# Patient Record
Sex: Female | Born: 1975 | Race: Black or African American | Hispanic: No | Marital: Married | Smoking: Never smoker
Health system: Southern US, Community
[De-identification: ages and names within clinical notes are randomized; demographics above are authoritative.]

## PROBLEM LIST (undated history)

## (undated) DIAGNOSIS — E785 Hyperlipidemia, unspecified: Secondary | ICD-10-CM

## (undated) DIAGNOSIS — O09529 Supervision of elderly multigravida, unspecified trimester: Secondary | ICD-10-CM

## (undated) DIAGNOSIS — R51 Headache: Secondary | ICD-10-CM

## (undated) DIAGNOSIS — F419 Anxiety disorder, unspecified: Secondary | ICD-10-CM

## (undated) DIAGNOSIS — Z8619 Personal history of other infectious and parasitic diseases: Secondary | ICD-10-CM

## (undated) DIAGNOSIS — F32A Depression, unspecified: Secondary | ICD-10-CM

## (undated) DIAGNOSIS — D219 Benign neoplasm of connective and other soft tissue, unspecified: Secondary | ICD-10-CM

## (undated) DIAGNOSIS — D649 Anemia, unspecified: Secondary | ICD-10-CM

## (undated) DIAGNOSIS — I10 Essential (primary) hypertension: Secondary | ICD-10-CM

## (undated) DIAGNOSIS — T7840XA Allergy, unspecified, initial encounter: Secondary | ICD-10-CM

## (undated) DIAGNOSIS — E669 Obesity, unspecified: Secondary | ICD-10-CM

## (undated) DIAGNOSIS — Z87440 Personal history of urinary (tract) infections: Secondary | ICD-10-CM

## (undated) HISTORY — PX: OTHER SURGICAL HISTORY: SHX169

## (undated) HISTORY — DX: Hyperlipidemia, unspecified: E78.5

## (undated) HISTORY — DX: Personal history of urinary (tract) infections: Z87.440

## (undated) HISTORY — DX: Depression, unspecified: F32.A

## (undated) HISTORY — DX: Personal history of other infectious and parasitic diseases: Z86.19

## (undated) HISTORY — DX: Obesity, unspecified: E66.9

## (undated) HISTORY — DX: Headache: R51

## (undated) HISTORY — DX: Anxiety disorder, unspecified: F41.9

## (undated) HISTORY — DX: Anemia, unspecified: D64.9

## (undated) HISTORY — DX: Supervision of elderly multigravida, unspecified trimester: O09.529

## (undated) HISTORY — DX: Essential (primary) hypertension: I10

## (undated) HISTORY — DX: Benign neoplasm of connective and other soft tissue, unspecified: D21.9

## (undated) HISTORY — DX: Allergy, unspecified, initial encounter: T78.40XA

## (undated) HISTORY — PX: MOUTH SURGERY: SHX715

---
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
## 2000-11-23 ENCOUNTER — Emergency Department (HOSPITAL_COMMUNITY): Admission: EM | Admit: 2000-11-23 | Discharge: 2000-11-23 | Payer: Self-pay | Admitting: Emergency Medicine

## 2000-11-23 ENCOUNTER — Encounter: Payer: Self-pay | Admitting: Emergency Medicine

## 2001-12-01 ENCOUNTER — Other Ambulatory Visit: Admission: RE | Admit: 2001-12-01 | Discharge: 2001-12-01 | Payer: Self-pay | Admitting: Obstetrics and Gynecology

## 2002-03-17 ENCOUNTER — Ambulatory Visit (HOSPITAL_COMMUNITY): Admission: RE | Admit: 2002-03-17 | Discharge: 2002-03-17 | Payer: Self-pay | Admitting: Obstetrics and Gynecology

## 2002-03-17 ENCOUNTER — Encounter: Payer: Self-pay | Admitting: Obstetrics and Gynecology

## 2002-04-21 ENCOUNTER — Encounter: Payer: Self-pay | Admitting: Obstetrics and Gynecology

## 2002-04-21 ENCOUNTER — Ambulatory Visit (HOSPITAL_COMMUNITY): Admission: RE | Admit: 2002-04-21 | Discharge: 2002-04-21 | Payer: Self-pay | Admitting: Obstetrics and Gynecology

## 2002-04-27 ENCOUNTER — Inpatient Hospital Stay (HOSPITAL_COMMUNITY): Admission: AD | Admit: 2002-04-27 | Discharge: 2002-04-27 | Payer: Self-pay | Admitting: Obstetrics and Gynecology

## 2002-08-01 ENCOUNTER — Encounter: Payer: Self-pay | Admitting: Obstetrics and Gynecology

## 2002-08-01 ENCOUNTER — Inpatient Hospital Stay (HOSPITAL_COMMUNITY): Admission: AD | Admit: 2002-08-01 | Discharge: 2002-08-01 | Payer: Self-pay | Admitting: Obstetrics and Gynecology

## 2002-08-08 ENCOUNTER — Inpatient Hospital Stay (HOSPITAL_COMMUNITY): Admission: AD | Admit: 2002-08-08 | Discharge: 2002-08-10 | Payer: Self-pay | Admitting: Obstetrics and Gynecology

## 2002-12-04 ENCOUNTER — Other Ambulatory Visit: Admission: RE | Admit: 2002-12-04 | Discharge: 2002-12-04 | Payer: Self-pay | Admitting: Obstetrics and Gynecology

## 2004-01-15 ENCOUNTER — Other Ambulatory Visit: Admission: RE | Admit: 2004-01-15 | Discharge: 2004-01-15 | Payer: Self-pay | Admitting: Obstetrics and Gynecology

## 2004-04-01 ENCOUNTER — Ambulatory Visit (HOSPITAL_COMMUNITY): Admission: RE | Admit: 2004-04-01 | Discharge: 2004-04-01 | Payer: Self-pay | Admitting: Obstetrics and Gynecology

## 2004-05-30 ENCOUNTER — Ambulatory Visit (HOSPITAL_COMMUNITY): Admission: RE | Admit: 2004-05-30 | Discharge: 2004-05-30 | Payer: Self-pay | Admitting: Obstetrics and Gynecology

## 2004-07-24 ENCOUNTER — Ambulatory Visit (HOSPITAL_COMMUNITY): Admission: RE | Admit: 2004-07-24 | Discharge: 2004-07-24 | Payer: Self-pay | Admitting: Obstetrics and Gynecology

## 2004-08-21 ENCOUNTER — Inpatient Hospital Stay (HOSPITAL_COMMUNITY): Admission: AD | Admit: 2004-08-21 | Discharge: 2004-08-23 | Payer: Self-pay | Admitting: Obstetrics and Gynecology

## 2004-09-10 ENCOUNTER — Encounter: Admission: RE | Admit: 2004-09-10 | Discharge: 2004-10-10 | Payer: Self-pay | Admitting: Obstetrics and Gynecology

## 2004-11-10 ENCOUNTER — Encounter: Admission: RE | Admit: 2004-11-10 | Discharge: 2004-12-10 | Payer: Self-pay | Admitting: Obstetrics and Gynecology

## 2004-12-11 ENCOUNTER — Encounter: Admission: RE | Admit: 2004-12-11 | Discharge: 2005-01-09 | Payer: Self-pay | Admitting: Obstetrics and Gynecology

## 2005-01-10 ENCOUNTER — Encounter: Admission: RE | Admit: 2005-01-10 | Discharge: 2005-02-09 | Payer: Self-pay | Admitting: Obstetrics and Gynecology

## 2005-01-15 ENCOUNTER — Other Ambulatory Visit: Admission: RE | Admit: 2005-01-15 | Discharge: 2005-01-15 | Payer: Self-pay | Admitting: Obstetrics and Gynecology

## 2005-02-10 ENCOUNTER — Encounter: Admission: RE | Admit: 2005-02-10 | Discharge: 2005-03-11 | Payer: Self-pay | Admitting: Obstetrics and Gynecology

## 2005-03-12 ENCOUNTER — Encounter: Admission: RE | Admit: 2005-03-12 | Discharge: 2005-04-11 | Payer: Self-pay | Admitting: Obstetrics and Gynecology

## 2005-04-12 ENCOUNTER — Encounter: Admission: RE | Admit: 2005-04-12 | Discharge: 2005-05-12 | Payer: Self-pay | Admitting: Obstetrics and Gynecology

## 2005-05-13 ENCOUNTER — Encounter: Admission: RE | Admit: 2005-05-13 | Discharge: 2005-06-09 | Payer: Self-pay | Admitting: Obstetrics and Gynecology

## 2008-12-12 ENCOUNTER — Other Ambulatory Visit: Admission: RE | Admit: 2008-12-12 | Discharge: 2008-12-12 | Payer: Self-pay | Admitting: *Deleted

## 2010-03-23 NOTE — L&D Delivery Note (Signed)
Delivery Note At 1:12 AM a viable female was delivered via Vaginal, Spontaneous Delivery (Presentation: Left Occiput Anterior).  APGAR: 8, 9; weight 7 lb 13.9 oz (3570 g).   Placenta status: Intact, Spontaneous.  Cord: 3 vessels with the following complications: None.  Cord pH: n/a  Anesthesia: Epidural  Episiotomy: None Lacerations: None Suture Repair: n/a Est. Blood Loss (mL): 250  Mom to postpartum.  Baby to nursery-stable.  Diondra Pines Y 01/15/2011, 9:25 AM

## 2010-04-13 ENCOUNTER — Encounter: Payer: Self-pay | Admitting: Obstetrics and Gynecology

## 2010-06-26 ENCOUNTER — Inpatient Hospital Stay (HOSPITAL_COMMUNITY): Payer: Medicaid Other

## 2010-06-26 ENCOUNTER — Inpatient Hospital Stay (HOSPITAL_COMMUNITY)
Admission: AD | Admit: 2010-06-26 | Discharge: 2010-06-26 | Disposition: A | Discharge status: Home / Self Care | Payer: Medicaid Other | Source: Ambulatory Visit | Attending: Obstetrics and Gynecology | Admitting: Obstetrics and Gynecology

## 2010-06-26 DIAGNOSIS — O10019 Pre-existing essential hypertension complicating pregnancy, unspecified trimester: Secondary | ICD-10-CM | POA: Insufficient documentation

## 2010-06-26 LAB — URINALYSIS, ROUTINE W REFLEX MICROSCOPIC
Bilirubin Urine: NEGATIVE
Glucose, UA: NEGATIVE mg/dL
Hgb urine dipstick: NEGATIVE
Ketones, ur: NEGATIVE mg/dL
Nitrite: NEGATIVE
Protein, ur: NEGATIVE mg/dL
Urobilinogen, UA: 0.2 mg/dL (ref 0.0–1.0)

## 2010-06-26 LAB — ABO/RH: ABO/RH(D): O POS

## 2010-06-26 LAB — DIFFERENTIAL
Basophils Relative: 0 % (ref 0–1)
Eosinophils Absolute: 0.2 10*3/uL (ref 0.0–0.7)
Monocytes Absolute: 0.4 10*3/uL (ref 0.1–1.0)
Neutro Abs: 6.8 10*3/uL (ref 1.7–7.7)

## 2010-06-26 LAB — CBC
HCT: 35.9 % — ABNORMAL LOW (ref 36.0–46.0)
MCV: 71.4 fL — ABNORMAL LOW (ref 78.0–100.0)
Platelets: 232 10*3/uL (ref 150–400)
RBC: 5.03 MIL/uL (ref 3.87–5.11)
WBC: 10.3 10*3/uL (ref 4.0–10.5)

## 2010-06-26 LAB — HIV ANTIBODY (ROUTINE TESTING W REFLEX): HIV: NONREACTIVE

## 2010-07-23 LAB — RUBELLA ANTIBODY, IGM: Rubella: IMMUNE
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
## 2010-08-08 NOTE — H&P (Signed)
Sarah Mcdowell, Sarah Mcdowell NO.:  [id number]   MEDICAL RECORD NO.:  [id number]                   PATIENT TYPE:  MAT   LOCATION:  [facility]                                 [facility]ILITY:  [facility]   PHYSICIAN:  Osborn Coho, M.D.                DATE OF BIRTH:  11/19/1975   DATE OF ADMISSION:  08/01/2002  DATE OF DISCHARGE:  08/01/2002                                HISTORY & PHYSICAL   HISTORY:  Ms. Sarah Mcdowell is a 35 year old gravida 3, para 0, with an uncertain  LMP secondary to a history of irregular menses with an estimated date of  delivery of Aug 17, 2002 at 38 weeks and 5 days, dated by a first trimester  ultrasound.  Pregnancy has been complicated by chronic hypertension, well  controlled on labetalol, increased BMI, uncertain LMP, history of fibroids,  and history of migraines.   PRENATAL COURSE:  At [facility] OB/GYN since approximately 9 weeks  estimated gestational age with elevated blood pressures in the first  trimester with a systolic blood pressure of 150 and diastolic blood pressure  of 100.  Started on blood pressure medicines early in the second trimester  with good control on labetalol.  The patient has been followed with twice  weekly NST starting at 32 weeks and growth scans in the third trimester.  Prenatal labs O positive, antibody negative, hemoglobin 11.6, hematocrit  36.8, platelets 252, sickle cell trait was negative, RPR was non reactive,  Rubella titer was positive, hepatitis B surface antigen was negative, HIV  was non reactive, pap smear in September 2003 was within normal limits,  gonorrhea was negative, and Chlamydia was negative.  Glucose challenge test  was abnormal in early pregnancy dated March 10, 2002 with a normal three  hour GTT, March 31, 2002 which was repeated, on May 29, 2002 and was also  normal.  She had an AST which was in the normal range and group B Strep was  positive.   Ultrasound dated January 12, 2002 with  an estimated date of delivery of Aug 17, 2002 at 9 weeks of estimated gestational age.  Ultrasound dated March 17, 2002 consistent with dates with normal but limited anatomy scan.  Ultrasound dated April 21, 2002 consistent with dates with normal  completed anatomy scan.  Ultrasound dated June 23, 2002 consistent with  dates with estimated fetal weight of 2,078, measuring in the 75th to 90th  percentile.  Ultrasound dated Aug 03, 2002 consistent with dates with an  estimated fetal weight of 3,383 grams, measuring in the 50th to 75th  percentile and normal AFI.   PAST MEDICAL HISTORY:  Chronic hypertension, history of intermittent anemia,  history of migraines, history of UTI years ago.   PAST OB HISTORY:  Elective termination of pregnancy x2 in the first  trimester without complications.   PAST GYN HISTORY:  History of irregular menses, history of small  fibroid,  history of Chlamydia and Trichomonas in 1992 treated with negative test of  cures, denies history of gonorrhea, herpes, reports a history of abnormal  pap smear approximately two years ago at an outside office and normal pap  smear since.   PAST SURGICAL HISTORY:  Denies.   MEDICINES:  1. Prenatal vitamins.  2. Labetalol 100 mg p.o. b.i.d.  3. Tylenol p.r.n.   ALLERGIES:  No known drug allergies.   SOCIAL HISTORY:  Denies cigarette use, reported occasional alcohol use prior  to pregnancy, denies drug use.  Father of baby is involved and supportive.   FAMILY HISTORY:  Diabetes, hypertension, seizures.   REVIEW OF SYSTEMS:  Upper respiratory symptoms recently and varicella as a  child.   PHYSICAL EXAMINATION:  (Aug 07, 2002) Blood pressure 130/80.  Weight 251  pounds.  Pre gravid weight 235 pounds.  HEENT:  Within normal.  THYROID:  No masses.  CHEST:  Clear to auscultation bilaterally.  HEART:  Rate and rhythm regular.  ABDOMEN:  Soft, gravid.  Fundal height of 39 cm.  EXTREMITIES:  Within normal limits.   PELVIC:  Gynecoid pelvis, cervix 3 cm dilated, 75% effaced, and minus 2  station, vertex.  Fetal heart tracing 130 baseline, positive accelerations,  no decelerations, positive long term variability.  Toco to 6 to 8 minutes.   ASSESSMENT AND PLAN:  Ms. Sarah Mcdowell is a 35 year old para 0 at 38 weeks and 5  days with chronic hypertension, being admitted for augmentation with  advanced cervical dilatation.  Will augment with Pitocin and AROM when  appropriate.  Will administer penicillin group B strep prophylaxis secondary  to testing positive for group B strep.  Continuous fetal monitoring.                                               Osborn Coho, M.D.    AR/MEDQ  D:  08/08/2002  T:  08/08/2002  Job:  [id number]

## 2011-01-02 ENCOUNTER — Telehealth (HOSPITAL_COMMUNITY): Payer: Self-pay | Admitting: *Deleted

## 2011-01-02 ENCOUNTER — Encounter (HOSPITAL_COMMUNITY): Payer: Self-pay | Admitting: *Deleted

## 2011-01-02 NOTE — Telephone Encounter (Signed)
Preadmission screen  

## 2011-01-14 ENCOUNTER — Encounter (HOSPITAL_COMMUNITY): Payer: Self-pay | Admitting: Anesthesiology

## 2011-01-14 ENCOUNTER — Other Ambulatory Visit: Payer: Self-pay | Admitting: Obstetrics and Gynecology

## 2011-01-14 ENCOUNTER — Inpatient Hospital Stay (HOSPITAL_COMMUNITY)
Admission: RE | Admit: 2011-01-14 | Discharge: 2011-01-17 | DRG: 767 | Disposition: A | Discharge status: Home / Self Care | Payer: Medicaid Other | Source: Ambulatory Visit | Attending: Obstetrics and Gynecology | Admitting: Obstetrics and Gynecology

## 2011-01-14 ENCOUNTER — Inpatient Hospital Stay (HOSPITAL_COMMUNITY): Payer: Medicaid Other | Admitting: Anesthesiology

## 2011-01-14 ENCOUNTER — Encounter (HOSPITAL_COMMUNITY): Payer: Self-pay

## 2011-01-14 DIAGNOSIS — O99210 Obesity complicating pregnancy, unspecified trimester: Secondary | ICD-10-CM | POA: Diagnosis present

## 2011-01-14 DIAGNOSIS — Z302 Encounter for sterilization: Secondary | ICD-10-CM

## 2011-01-14 DIAGNOSIS — Z3009 Encounter for other general counseling and advice on contraception: Secondary | ICD-10-CM | POA: Diagnosis present

## 2011-01-14 DIAGNOSIS — O10919 Unspecified pre-existing hypertension complicating pregnancy, unspecified trimester: Secondary | ICD-10-CM | POA: Diagnosis present

## 2011-01-14 DIAGNOSIS — O1002 Pre-existing essential hypertension complicating childbirth: Principal | ICD-10-CM | POA: Diagnosis present

## 2011-01-14 LAB — CBC
HCT: 35.1 % — ABNORMAL LOW (ref 36.0–46.0)
RDW: 15.4 % (ref 11.5–15.5)
WBC: 9.2 10*3/uL (ref 4.0–10.5)

## 2011-01-14 MED ORDER — CITRIC ACID-SODIUM CITRATE 334-500 MG/5ML PO SOLN: 30.0000 mL | ORAL | Status: DC | PRN

## 2011-01-14 MED ORDER — ONDANSETRON HCL 4 MG/2ML IJ SOLN: 4.0000 mg | Freq: Four times a day (QID) | INTRAMUSCULAR | Status: DC | PRN

## 2011-01-14 MED ORDER — LIDOCAINE HCL (PF) 1 % IJ SOLN
30.0000 mL | INTRAMUSCULAR | Status: DC | PRN
  Filled 2011-01-14 (×2): qty 30

## 2011-01-14 MED ORDER — LACTATED RINGERS IV SOLN: 500.0000 mL | Freq: Once | INTRAVENOUS | Status: DC

## 2011-01-14 MED ORDER — LABETALOL HCL 5 MG/ML IV SOLN: 10.0000 mg | INTRAVENOUS | Status: DC | PRN

## 2011-01-14 MED ORDER — EPHEDRINE 5 MG/ML INJ: 10.0000 mg | INTRAVENOUS | Status: DC | PRN

## 2011-01-14 MED ORDER — FENTANYL 2.5 MCG/ML BUPIVACAINE 1/10 % EPIDURAL INFUSION (WH - ANES)
INTRAMUSCULAR | Status: DC | PRN
  Administered 2011-01-14: 14 mL/h via EPIDURAL

## 2011-01-14 MED ORDER — PHENYLEPHRINE 40 MCG/ML (10ML) SYRINGE FOR IV PUSH (FOR BLOOD PRESSURE SUPPORT): 80.0000 ug | PREFILLED_SYRINGE | INTRAVENOUS | Status: DC | PRN

## 2011-01-14 MED ORDER — LACTATED RINGERS IV SOLN
500.0000 mL | INTRAVENOUS | Status: DC | PRN
  Administered 2011-01-14: 1000 mL via INTRAVENOUS

## 2011-01-14 MED ORDER — FLEET ENEMA 7-19 GM/118ML RE ENEM: 1.0000 | ENEMA | RECTAL | Status: DC | PRN

## 2011-01-14 MED ORDER — IBUPROFEN 600 MG PO TABS: 600.0000 mg | ORAL_TABLET | Freq: Four times a day (QID) | ORAL | Status: DC | PRN

## 2011-01-14 MED ORDER — OXYCODONE-ACETAMINOPHEN 5-325 MG PO TABS: 2.0000 | ORAL_TABLET | ORAL | Status: DC | PRN

## 2011-01-14 MED ORDER — DIPHENHYDRAMINE HCL 50 MG/ML IJ SOLN: 12.5000 mg | INTRAMUSCULAR | Status: DC | PRN

## 2011-01-14 MED ORDER — ACETAMINOPHEN 325 MG PO TABS: 650.0000 mg | ORAL_TABLET | ORAL | Status: DC | PRN

## 2011-01-14 MED ORDER — ZOLPIDEM TARTRATE 10 MG PO TABS: 10.0000 mg | ORAL_TABLET | Freq: Every evening | ORAL | Status: DC | PRN

## 2011-01-14 MED ORDER — OXYTOCIN 20 UNITS IN LACTATED RINGERS INFUSION - SIMPLE
1.0000 m[IU]/min | INTRAVENOUS | Status: DC
Start: 2011-01-15 — End: 2011-01-15
  Administered 2011-01-14: 1 m[IU]/min via INTRAVENOUS
  Filled 2011-01-14: qty 1000

## 2011-01-14 MED ORDER — LACTATED RINGERS IV SOLN
INTRAVENOUS | Status: DC
  Administered 2011-01-14 (×2): 125 mL/h via INTRAVENOUS

## 2011-01-14 MED ORDER — NALBUPHINE SYRINGE 5 MG/0.5 ML
5.0000 mg | INJECTION | INTRAMUSCULAR | Status: DC | PRN
  Filled 2011-01-14: qty 0.5

## 2011-01-14 MED ORDER — FENTANYL 2.5 MCG/ML BUPIVACAINE 1/10 % EPIDURAL INFUSION (WH - ANES)
14.0000 mL/h | INTRAMUSCULAR | Status: DC
  Filled 2011-01-14: qty 60

## 2011-01-14 MED ORDER — PHENYLEPHRINE 40 MCG/ML (10ML) SYRINGE FOR IV PUSH (FOR BLOOD PRESSURE SUPPORT)
80.0000 ug | PREFILLED_SYRINGE | INTRAVENOUS | Status: DC | PRN
  Filled 2011-01-14: qty 5

## 2011-01-14 MED ORDER — SODIUM BICARBONATE 8.4 % IV SOLN
INTRAVENOUS | Status: DC | PRN
  Administered 2011-01-14: 5 mL via EPIDURAL

## 2011-01-14 MED ORDER — TERBUTALINE SULFATE 1 MG/ML IJ SOLN: 0.2500 mg | Freq: Once | INTRAMUSCULAR | Status: AC | PRN

## 2011-01-14 MED ORDER — OXYTOCIN BOLUS FROM INFUSION
500.0000 mL | Freq: Once | INTRAVENOUS | Status: DC
  Filled 2011-01-14: qty 1000
  Filled 2011-01-14: qty 500

## 2011-01-14 MED ORDER — OXYTOCIN 20 UNITS IN LACTATED RINGERS INFUSION - SIMPLE
125.0000 mL/h | Freq: Once | INTRAVENOUS | Status: AC
  Administered 2011-01-15: 999 mL/h via INTRAVENOUS

## 2011-01-14 MED ORDER — EPHEDRINE 5 MG/ML INJ
10.0000 mg | INTRAVENOUS | Status: DC | PRN
  Filled 2011-01-14: qty 4

## 2011-01-14 NOTE — Anesthesia Preprocedure Evaluation (Addendum)
Anesthesia Evaluation  Patient identified by MRN, date of birth, ID band Patient awake  General Assessment Comment  Reviewed: Allergy & Precautions, H&P , Patient's Chart, lab work & pertinent test results  Airway Mallampati: II TM Distance: >3 FB Neck ROM: full    Dental  (+) Teeth Intact   Pulmonary  clear to auscultation        Cardiovascular hypertension (chronic HTN, no PIH), regular Normal    Neuro/Psych    GI/Hepatic   Endo/Other  Morbid obesity  Renal/GU      Musculoskeletal   Abdominal   Peds  Hematology   Anesthesia Other Findings       Reproductive/Obstetrics (+) Pregnancy                          Anesthesia Physical Anesthesia Plan  ASA: II  Anesthesia Plan: Epidural   Post-op Pain Management:    Induction:   Airway Management Planned:   Additional Equipment:   Intra-op Plan:   Post-operative Plan:   Informed Consent: I have reviewed the patients History and Physical, chart, labs and discussed the procedure including the risks, benefits and alternatives for the proposed anesthesia with the patient or authorized representative who has indicated his/her understanding and acceptance.   Dental Advisory Given  Plan Discussed with:   Anesthesia Plan Comments: (Labs checked- platelets confirmed with RN in room. Fetal heart tracing, per RN, reported to be stable enough for sitting procedure. Discussed epidural, and patient consents to the procedure:  included risk of possible headache,backache, failed block, allergic reaction, and nerve injury. This patient was asked if she had any questions or concerns before the procedure started. )        Anesthesia Quick Evaluation

## 2011-01-14 NOTE — Anesthesia Procedure Notes (Signed)
Epidural Patient location during procedure: OB  Preanesthetic Checklist Completed: patient identified, site marked, surgical consent, timeout performed, IV checked, risks and benefits discussed and monitors and equipment checked  Epidural Patient position: sitting Prep: site prepped and draped and DuraPrep Patient monitoring: continuous pulse ox and blood pressure Approach: midline Injection technique: LOR air  Needle:  Needle type: Tuohy  Needle gauge: 17 G Needle length: 9 cm Needle insertion depth: 6 cm Catheter type: closed end flexible Catheter size: 19 Gauge Catheter at skin depth: 13 cm Test dose: negative  Assessment Events: blood not aspirated, injection not painful, no injection resistance, negative IV test and no paresthesia  Additional Notes Dosing of Epidural:  1st dose, through needle ............................................Marland Kitchen epi 1:200K + Xylocaine 40 mg  2nd dose, through catheter, after waiting 3 minutes...Marland KitchenMarland Kitchenepi 1:200K + Xylocaine 60 mg  3rd dose, through catheter after waiting 3 minutes .............................Marcaine   5mg    ( mg Marcaine are expressed as equivilent  cc's medication removed from the 0.1%Bupiv / fentanyl syringe from L&D pump)  ( 2% Xylo charted as a single dose in Epic Meds for ease of charting; actual dosing was fractionated as above, for saftey's sake)  As each dose occurred, patient was free of IV sx; and patient exhibited no evidence of SA injection.  Patient is more comfortable after epidural dosed. Please see RN's note for documentation of vital signs,and FHR which are stable.
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
## 2011-01-14 NOTE — Progress Notes (Signed)
Dr. Su Hilt notified of pt status, FHR, UC pattern, SVE and maternal BP. Orders received. Will continue to monitor.

## 2011-01-15 ENCOUNTER — Encounter (HOSPITAL_COMMUNITY): Payer: Self-pay | Admitting: Anesthesiology

## 2011-01-15 ENCOUNTER — Inpatient Hospital Stay (HOSPITAL_COMMUNITY): Payer: Medicaid Other | Admitting: Anesthesiology

## 2011-01-15 ENCOUNTER — Other Ambulatory Visit: Payer: Self-pay | Admitting: Obstetrics and Gynecology

## 2011-01-15 ENCOUNTER — Encounter (HOSPITAL_COMMUNITY): Payer: Self-pay

## 2011-01-15 ENCOUNTER — Encounter (HOSPITAL_COMMUNITY)
Admission: RE | Disposition: A | Discharge status: Home / Self Care | Payer: Self-pay | Source: Ambulatory Visit | Attending: Obstetrics and Gynecology

## 2011-01-15 DIAGNOSIS — Z3009 Encounter for other general counseling and advice on contraception: Secondary | ICD-10-CM | POA: Diagnosis present

## 2011-01-15 DIAGNOSIS — O99210 Obesity complicating pregnancy, unspecified trimester: Secondary | ICD-10-CM | POA: Diagnosis present

## 2011-01-15 DIAGNOSIS — O10919 Unspecified pre-existing hypertension complicating pregnancy, unspecified trimester: Secondary | ICD-10-CM | POA: Diagnosis present

## 2011-01-15 HISTORY — PX: TUBAL LIGATION: SHX77

## 2011-01-15 LAB — MRSA PCR SCREENING: MRSA by PCR: NEGATIVE

## 2011-01-15 LAB — CBC
Hemoglobin: 10.8 g/dL — ABNORMAL LOW (ref 12.0–15.0)
MCH: 23.7 pg — ABNORMAL LOW (ref 26.0–34.0)
RBC: 4.55 MIL/uL (ref 3.87–5.11)

## 2011-01-15 LAB — RPR: RPR Ser Ql: NONREACTIVE

## 2011-01-15 SURGERY — LIGATION, FALLOPIAN TUBE, POSTPARTUM
Anesthesia: Epidural | Site: Abdomen | Laterality: Bilateral | Wound class: Clean Contaminated

## 2011-01-15 MED ORDER — SENNOSIDES-DOCUSATE SODIUM 8.6-50 MG PO TABS
2.0000 | ORAL_TABLET | Freq: Every day | ORAL | Status: DC
  Administered 2011-01-15 – 2011-01-16 (×2): 2 via ORAL

## 2011-01-15 MED ORDER — BENZOCAINE-MENTHOL 20-0.5 % EX AERO
1.0000 "application " | INHALATION_SPRAY | CUTANEOUS | Status: DC | PRN
  Administered 2011-01-15: 1 via TOPICAL
  Filled 2011-01-15: qty 56

## 2011-01-15 MED ORDER — LIDOCAINE-EPINEPHRINE 2 %-1:100000 IJ SOLN
INTRAMUSCULAR | Status: DC | PRN
  Administered 2011-01-15: 3 mL via INTRADERMAL
  Administered 2011-01-15 (×3): 5 mL via INTRADERMAL
  Administered 2011-01-15: 2 mL via INTRADERMAL

## 2011-01-15 MED ORDER — FENTANYL CITRATE 0.05 MG/ML IJ SOLN: 25.0000 ug | INTRAMUSCULAR | Status: DC | PRN

## 2011-01-15 MED ORDER — LACTATED RINGERS IV SOLN
INTRAVENOUS | Status: DC | PRN
  Administered 2011-01-15: 15:00:00 via INTRAVENOUS

## 2011-01-15 MED ORDER — PROMETHAZINE HCL 25 MG/ML IJ SOLN: 6.2500 mg | INTRAMUSCULAR | Status: DC | PRN

## 2011-01-15 MED ORDER — LACTATED RINGERS IV SOLN
INTRAVENOUS | Status: DC
  Administered 2011-01-15: 20 mL/h via INTRAVENOUS

## 2011-01-15 MED ORDER — BENZOCAINE-MENTHOL 20-0.5 % EX AERO
INHALATION_SPRAY | CUTANEOUS | Status: AC
  Administered 2011-01-15: 1 via TOPICAL
  Filled 2011-01-15: qty 56

## 2011-01-15 MED ORDER — IBUPROFEN 600 MG PO TABS
600.0000 mg | ORAL_TABLET | Freq: Four times a day (QID) | ORAL | Status: DC
  Administered 2011-01-15 – 2011-01-17 (×10): 600 mg via ORAL
  Filled 2011-01-15 (×10): qty 1

## 2011-01-15 MED ORDER — SODIUM BICARBONATE 8.4 % IV SOLN
INTRAVENOUS | Status: AC
  Filled 2011-01-15: qty 50

## 2011-01-15 MED ORDER — KETOROLAC TROMETHAMINE 30 MG/ML IJ SOLN: 15.0000 mg | Freq: Once | INTRAMUSCULAR | Status: AC | PRN

## 2011-01-15 MED ORDER — FAMOTIDINE 20 MG PO TABS
40.0000 mg | ORAL_TABLET | Freq: Once | ORAL | Status: AC
  Administered 2011-01-15: 40 mg via ORAL
  Filled 2011-01-15: qty 2

## 2011-01-15 MED ORDER — OXYCODONE-ACETAMINOPHEN 5-325 MG PO TABS
1.0000 | ORAL_TABLET | ORAL | Status: DC | PRN
  Administered 2011-01-15 – 2011-01-17 (×8): 2 via ORAL
  Filled 2011-01-15 (×8): qty 2

## 2011-01-15 MED ORDER — MIDAZOLAM HCL 5 MG/5ML IJ SOLN
INTRAMUSCULAR | Status: DC | PRN
  Administered 2011-01-15: 2 mg via INTRAVENOUS

## 2011-01-15 MED ORDER — ONDANSETRON HCL 4 MG/2ML IJ SOLN: 4.0000 mg | INTRAMUSCULAR | Status: DC | PRN

## 2011-01-15 MED ORDER — SIMETHICONE 80 MG PO CHEW
80.0000 mg | CHEWABLE_TABLET | ORAL | Status: DC | PRN
  Administered 2011-01-15: 80 mg via ORAL

## 2011-01-15 MED ORDER — ZOLPIDEM TARTRATE 5 MG PO TABS: 5.0000 mg | ORAL_TABLET | Freq: Every evening | ORAL | Status: DC | PRN

## 2011-01-15 MED ORDER — WITCH HAZEL-GLYCERIN EX PADS: 1.0000 "application " | MEDICATED_PAD | CUTANEOUS | Status: DC | PRN

## 2011-01-15 MED ORDER — NIFEDIPINE ER 30 MG PO TB24
30.0000 mg | ORAL_TABLET | Freq: Every day | ORAL | Status: DC
  Administered 2011-01-15 – 2011-01-17 (×3): 30 mg via ORAL
  Filled 2011-01-15 (×3): qty 1

## 2011-01-15 MED ORDER — PRENATAL PLUS 27-1 MG PO TABS
1.0000 | ORAL_TABLET | Freq: Every day | ORAL | Status: DC
  Administered 2011-01-15 – 2011-01-17 (×3): 1 via ORAL
  Filled 2011-01-15 (×3): qty 1

## 2011-01-15 MED ORDER — TETANUS-DIPHTH-ACELL PERTUSSIS 5-2.5-18.5 LF-MCG/0.5 IM SUSP: 0.5000 mL | Freq: Once | INTRAMUSCULAR | Status: DC

## 2011-01-15 MED ORDER — MIDAZOLAM HCL 2 MG/2ML IJ SOLN
INTRAMUSCULAR | Status: AC
  Filled 2011-01-15: qty 2

## 2011-01-15 MED ORDER — LIDOCAINE-EPINEPHRINE (PF) 2 %-1:200000 IJ SOLN
INTRAMUSCULAR | Status: AC
  Filled 2011-01-15: qty 20

## 2011-01-15 MED ORDER — DIPHENHYDRAMINE HCL 25 MG PO CAPS: 25.0000 mg | ORAL_CAPSULE | Freq: Four times a day (QID) | ORAL | Status: DC | PRN

## 2011-01-15 MED ORDER — LANOLIN HYDROUS EX OINT: TOPICAL_OINTMENT | CUTANEOUS | Status: DC | PRN

## 2011-01-15 MED ORDER — METOCLOPRAMIDE HCL 10 MG PO TABS
10.0000 mg | ORAL_TABLET | Freq: Once | ORAL | Status: AC
  Administered 2011-01-15: 10 mg via ORAL
  Filled 2011-01-15: qty 1

## 2011-01-15 MED ORDER — BUPIVACAINE HCL (PF) 0.25 % IJ SOLN
INTRAMUSCULAR | Status: DC | PRN
  Administered 2011-01-15: 10 mL

## 2011-01-15 MED ORDER — ACETAMINOPHEN 325 MG PO TABS: 325.0000 mg | ORAL_TABLET | ORAL | Status: DC | PRN

## 2011-01-15 MED ORDER — DIBUCAINE 1 % RE OINT
1.0000 "application " | TOPICAL_OINTMENT | RECTAL | Status: DC | PRN
  Filled 2011-01-15: qty 28

## 2011-01-15 MED ORDER — ONDANSETRON HCL 4 MG PO TABS: 4.0000 mg | ORAL_TABLET | ORAL | Status: DC | PRN

## 2011-01-15 SURGICAL SUPPLY — 21 items
ADH SKN CLS APL DERMABOND .7 (GAUZE/BANDAGES/DRESSINGS)
CHLORAPREP W/TINT 26ML (MISCELLANEOUS) ×2 IMPLANT
CLOTH BEACON ORANGE TIMEOUT ST (SAFETY) ×2 IMPLANT
CONTAINER PREFILL 10% NBF 15ML (MISCELLANEOUS) ×4 IMPLANT
DERMABOND ADVANCED (GAUZE/BANDAGES/DRESSINGS)
DERMABOND ADVANCED .7 DNX12 (GAUZE/BANDAGES/DRESSINGS) IMPLANT
ELECT REM PT RETURN 9FT ADLT (ELECTROSURGICAL) ×2
ELECTRODE REM PT RTRN 9FT ADLT (ELECTROSURGICAL) ×1 IMPLANT
GLOVE SURG SS PI 6.5 STRL IVOR (GLOVE) ×4 IMPLANT
GOWN PREVENTION PLUS LG XLONG (DISPOSABLE) ×4 IMPLANT
NEEDLE HYPO 22GX1.5 SAFETY (NEEDLE) ×2 IMPLANT
NS IRRIG 1000ML POUR BTL (IV SOLUTION) ×2 IMPLANT
PACK ABDOMINAL MINOR (CUSTOM PROCEDURE TRAY) ×2 IMPLANT
PENCIL BUTTON HOLSTER BLD 10FT (ELECTRODE) ×2 IMPLANT
SPONGE LAP 4X18 X RAY DECT (DISPOSABLE) ×1 IMPLANT
SUT CHROMIC 2 0 SH (SUTURE) ×2 IMPLANT
SUT VIC AB 0 CT2 27 (SUTURE) ×2 IMPLANT
SUT VIC AB 3-0 X1 27 (SUTURE) IMPLANT
SYR CONTROL 10ML LL (SYRINGE) ×2 IMPLANT
TOWEL OR 17X24 6PK STRL BLUE (TOWEL DISPOSABLE) ×4 IMPLANT
WATER STERILE IRR 1000ML POUR (IV SOLUTION) ×2 IMPLANT

## 2011-01-15 NOTE — Progress Notes (Signed)
Post Partum Day 0--PP BTL scheduled later today. Subjective: no complaints.  Up ad lib.  No syncope or dizziness.  Breast feeding.  No headache, visual symptoms, epigastric pain.  Objective: Blood pressure 129/79, pulse 101, temperature 98.4 F (36.9 C), temperature source Oral, resp. rate 18, height 5\' 8"  (1.727 m), weight 112.492 kg (248 lb), SpO2 100.00%, unknown if currently breastfeeding.  Physical Exam:  General: alert Lochia: appropriate Uterine Fundus: firm Incision: healing well DVT Evaluation: No evidence of DVT seen on physical exam. Negative Homan's sign. Continues on Procardia 30 mg XL q day.  Just received today's dose at 11am.  Filed Vitals:   01/15/11 0230 01/15/11 0333 01/15/11 0428 01/15/11 0815  BP: 149/63 119/76 126/70 129/79  Pulse: 99 102 98 101  Temp:  98 F (36.7 C) 98.4 F (36.9 C) 98.4 F (36.9 C)  TempSrc:  Oral Oral Oral  Resp: 18 20 18 18   Height:      Weight:      SpO2:         Basename 01/15/11 0529 01/14/11 2015  HGB 10.8* 11.3*  HCT 33.1* 35.1*    Assessment/Plan: Continue current care. Await BTL today. Monitor BP.   LOS: 1 day   Dorse Locy 01/15/2011, 11:16 AM

## 2011-01-15 NOTE — Progress Notes (Signed)
Sarah Mcdowell is a 35 y.o. [id number] at [redacted]w[redacted]d admitted for induction of labor due to Hypertension.  Subjective: No complaints  Objective: BP 145/62  Pulse 97  Temp(Src) 98 F (36.7 C) (Oral)  Resp 20  Ht 5\' 8"  (1.727 m)  Wt 112.492 kg (248 lb)  BMI 37.71 kg/m2  SpO2 100%      FHT:  FHR: 130s-140s bpm, variability: moderate,  accelerations:  Present,  decelerations:  Absent UC:   regular, every 2 minutes SVE:   Dilation: 6.5 Effacement (%): 80 Station: -2 Exam by:: Dr. Su Hilt  Labs: Lab Results  Component Value Date   WBC 9.2 01/14/2011   HGB 11.3* 01/14/2011   HCT 35.1* 01/14/2011   MCV 73.0* 01/14/2011   PLT 209 01/14/2011    Assessment / Plan: Induction of labor due to hypertension,  progressing well on pitocin. Fetal status is overall reassuring.  Pain controlled with epidural and BP under control.    Sarah Mcdowell Y 01/15/2011, 1:23 AM

## 2011-01-15 NOTE — Addendum Note (Signed)
Addendum  created 01/15/11 1936 by Billiejean Schimek Edward Florentino Laabs   Modules edited:Notes Section    

## 2011-01-15 NOTE — Op Note (Signed)
01/14/2011 - 01/15/2011  3:59 PM  PATIENT:  Sarah Mcdowell  35 y.o. female  PRE-OPERATIVE DIAGNOSIS:  Desires Permanant Sterilization  POST-OPERATIVE DIAGNOSIS:  Desires Permanant Sterilization  PROCEDURE:  Procedure(s): POST PARTUM TUBAL LIGATION  SURGEON:  Surgeon(s): Hal Morales, MD  ASSISTANTS: none   ANESTHESIA:   epidural  ESTIMATED BLOOD LOSS: * No blood loss amount entered *   BLOOD ADMINISTERED:none  LOCAL MEDICATIONS USED:  MARCAINE 10CC  COMPLICATIONS:NONE  FINDINGS:Tubes normal for the postpartum state  SPECIMEN:  Source of Specimen:  Portions of right and left fallopian tubes  DISPOSITION OF SPECIMEN:  PATHOLOGY  COUNTS:  YES  DESCRIPTION OF PROCEDURE:the patient was taken to the operating room after appropriate identification and placed on the operating table. The abdomen was prepped with multiple layers of chloraprep and draped as a sterile field. Subumbilical injection of 10 cc of quarter percent Marcaine was undertaken. A subumbilical incision was made and the peritoneum entered with a combination of blunt and sharp dissection.  The left fallopian tube was then identified, followed to its fimbriated end, and grasped at the isthmic portion and elevated. A suture of 2-0 chromic was placed through the mesosalpinx and tied fore and aft on the tube. A second ligature was placed proximal to that and the intervening knuckle of tube excised. The cut ends were cauterized and bathed with 1 cc of 0.025% Marcaine.  A similar procedure was carried out on the opposite side. Hemostasis was noted to be adequate.  The abdominal peritoneum was closed in pursestring fashion with a suture of 0 Vicryl. The fascia was closed with a suture of 0 Vicryl in a running fashion. The subumbilical incision was closed withDermabond.  The patient was taken from the operating room to the recovery room in satisfactory condition having tolerated the procedure well the sponge and  instrument counts correct.  PLAN OF CARE: Return to postpartum care  PATIENT DISPOSITION:  PACU - hemodynamically stable.   Delay start of Pharmacological VTE agent (>24hrs) due to surgical blood loss or risk of bleeding: and early ambulation.

## 2011-01-15 NOTE — Progress Notes (Signed)
NSVD of viable female. 

## 2011-01-15 NOTE — Anesthesia Postprocedure Evaluation (Signed)
  Anesthesia Post-op Note  Patient: Sarah Mcdowell This patient has recovered from her labor epidural, and I am not aware of any complications or problems.

## 2011-01-15 NOTE — Progress Notes (Signed)
UR Chart review completed.  

## 2011-01-15 NOTE — Transfer of Care (Signed)
Immediate Anesthesia Transfer of Care Note  Patient: Sarah Mcdowell  Procedure(s) Performed:  POST PARTUM TUBAL LIGATION  Patient Location: PACU  Anesthesia Type: Epidural  Level of Consciousness: awake  Airway & Oxygen Therapy: Patient Spontanous Breathing  Post-op Assessment: Report given to PACU RN and Post -op Vital signs reviewed and stable  Post vital signs: Reviewed and stable  Complications: No apparent anesthesia complications

## 2011-01-15 NOTE — Progress Notes (Addendum)
Post Partum Day 0  UPDATE TO H&P Subjective: no complaints, voiding and tolerating PO  Objective: Blood pressure 129/79, pulse 101, temperature 98.4 F (36.9 C), temperature source Oral, resp. rate 18, height 5\' 8"  (1.727 m), weight 248 lb (112.492 kg), SpO2 100.00%, unknown if currently breastfeeding.  Physical Exam:  General: alert, cooperative and no distress Lochia: appropriate Uterine Fundus: firm Incision: na DVT Evaluation: No evidence of DVT seen on physical exam.   Basename 01/15/11 0529 01/14/11 2015  HGB 10.8* 11.3*  HCT 33.1* 35.1*    Assessment/Plan: Contraception desires post partum tubal ligation. Discussed the indications, risks and benefits of the procedure which include, but are not limited to anesthesia, bleeding, infection, and damage to adjacent organs.  There is also a small risk of sterilization failure with subsequent pregnancy.  The patient acknowledged understanding and wishes to proceed.   LOS: 1 day   Kalyani Maeda P 01/15/2011, 1:12 PM

## 2011-01-15 NOTE — Anesthesia Postprocedure Evaluation (Signed)
Anesthesia Post Note  Patient: Sarah Mcdowell  Procedure(s) Performed:  POST PARTUM TUBAL LIGATION  Anesthesia type: Epidural  Patient location:pacu Post pain: Pain level controlled  Post assessment: Post-op Vital signs reviewed  Last Vitals:  Filed Vitals:   01/15/11 1600  BP:   Pulse:   Temp:   Resp: 16    Post vital signs: Reviewed  Level of consciousness: awake  Complications: No apparent anesthesia complications

## 2011-01-15 NOTE — Anesthesia Preprocedure Evaluation (Addendum)
Anesthesia Evaluation  Patient identified by MRN, date of birth, ID band Patient awake  General Assessment Comment  Reviewed: Allergy & Precautions, H&P , Patient's Chart, lab work & pertinent test results  Airway Mallampati: II TM Distance: >3 FB Neck ROM: full    Dental  (+) Teeth Intact   Pulmonary  clear to auscultation        Cardiovascular hypertension (chronic HTN, no PIH), regular Normal    Neuro/Psych    GI/Hepatic   Endo/Other  Morbid obesity  Renal/GU      Musculoskeletal   Abdominal   Peds  Hematology   Anesthesia Other Findings       Reproductive/Obstetrics (+) Pregnancy                           Anesthesia Physical Anesthesia Plan  ASA: III  Anesthesia Plan: Epidural   Post-op Pain Management:    Induction:   Airway Management Planned:   Additional Equipment:   Intra-op Plan:   Post-operative Plan:   Informed Consent:   Plan Discussed with:   Anesthesia Plan Comments:         Anesthesia Quick Evaluation

## 2011-01-16 MED ORDER — HYDROMORPHONE HCL 2 MG PO TABS
2.0000 mg | ORAL_TABLET | ORAL | Status: DC | PRN
  Administered 2011-01-16 (×2): 2 mg via ORAL
  Filled 2011-01-16 (×2): qty 1

## 2011-01-16 NOTE — Addendum Note (Signed)
Addendum  created 01/16/11 0827 by Suella Grove   Modules edited:Notes Section
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
## 2011-01-16 NOTE — Progress Notes (Signed)
Post Partum Day 1, Post tubal day 1 Subjective: Sleepy following pain meds.  Denies N, V, dizziness, or syncope.  Breastfeeding.  Has some gas from BTL, but has been walking to relieve.  Objective: Blood pressure 147/88, pulse 93, temperature 98.5 F (36.9 C), temperature source Oral, resp. rate 20, height 5\' 8"  (1.727 m), weight 112.492 kg (248 lb), SpO2 99.00%, unknown if currently breastfeeding.  Physical Exam:  General: Sleepy, but oriented Lochia: appropriate Uterine Fundus: firm Incision: Dressing on BTL incision CDI DVT Evaluation: No evidence of DVT seen on physical exam. Negative Homan's sign.   Basename 01/15/11 0529 01/14/11 2015  HGB 10.8* 11.3*  HCT 33.1* 35.1*    Assessment/Plan: Plan for discharge tomorrow Encourage ambulation and fluids.   LOS: 2 days   Sarah Mcdowell 01/16/2011, 10:26 AM

## 2011-01-16 NOTE — Anesthesia Postprocedure Evaluation (Signed)
  Anesthesia Post-op Note  Patient: Sarah Mcdowell  Procedure(s) Performed:  POST PARTUM TUBAL LIGATION  Patient Location: Mother/Baby  Anesthesia Type: Epidural  Level of Consciousness: awake  Airway and Oxygen Therapy: Patient Spontanous Breathing  Post-op Pain: none  Post-op Assessment: Patient's Cardiovascular Status Stable, Respiratory Function Stable, No signs of Nausea or vomiting, Adequate PO intake and Pain level controlled  Post-op Vital Signs: Reviewed and stable  Complications: No apparent anesthesia complications

## 2011-01-16 NOTE — Progress Notes (Signed)
Pt complains of incisional pain not relieved with percocet or motirn.  Pt tolerating PO no N/V ABD ND soft NT incision CDI Will give Dilaudid PRN

## 2011-01-17 MED ORDER — OXYCODONE-ACETAMINOPHEN 5-325 MG PO TABS: 1.0000 | ORAL_TABLET | Freq: Four times a day (QID) | ORAL | Status: AC | PRN

## 2011-01-17 MED ORDER — IBUPROFEN 600 MG PO TABS: 600.0000 mg | ORAL_TABLET | Freq: Four times a day (QID) | ORAL | Status: AC

## 2011-01-17 NOTE — Discharge Summary (Signed)
Obstetric Discharge Summary Reason for Admission: induction of labor Prenatal Procedures: NST and ultrasound Intrapartum Procedures: spontaneous vaginal delivery Postpartum Procedures: P.P. tubal ligation Complications-Operative and Postpartum: none Hemoglobin  Date Value Range Status  01/15/2011 10.8* 12.0-15.0 (g/dL) Final     HCT  Date Value Range Status  01/15/2011 33.1* 36.0-46.0 (%) Final    Discharge Diagnoses: Term Pregnancy-delivered  Discharge Information: Date: 01/17/2011 Activity: unrestricted Diet: routine Medications: Ibuprofen, Percocet and Procardia XL 30mg   Condition: stable Instructions: refer to practice specific booklet Discharge to: home Follow-up Information    Follow up with [facility] in 6 weeks.        Newborn Data: Live born female  Birth Weight: 7 lb 13.9 oz (3570 g) APGAR: 8, 9 Plans circumcision in [facility] office. Home with mother.  Sarah Mcdowell O. 01/17/2011, 11:48 AM

## 2011-01-17 NOTE — Progress Notes (Signed)
Post Partum Day 2 Subjective: Reports feeling well.  Ambulating, voiding and tol po liquids and solids without difficulty.  Denies weakness or dizziness.  Breastfeeding without difficulty.  Pos flatus and BM.  Reports mild pain at umbilical incision site which is improved by medication.    Objective: Blood pressure 123/74, pulse 94, temperature 98.3 F (36.8 C), temperature source Oral, resp. rate 20, height 5\' 8"  (1.727 m), weight 112.492 kg (248 lb), SpO2 99.00%, unknown if currently breastfeeding.  Physical Exam:  General: alert, cooperative and no distress Heart:  RRR Lungs:  Clear to a/p ausc. Abd:  Soft/non-tender with positive BS x 4 quads. Lochia: appropriate, scant rubra Uterine Fundus: firm, non-tender, 1 below umbilicus Incision: healing well, dry, intact without redness noted.   DVT Evaluation: No evidence of DVT seen on physical exam. Negative Homan's sign bilaterally. No significant calf/ankle edema.   Basename 01/15/11 0529 01/14/11 2015  HGB 10.8* 11.3*  HCT 33.1* 35.1*    Assessment/Plan: Stable s/p vaginal delivery and post-partum bilateral tubal ligation Chronic Hypertension  Discharge home Discharge instructions reviewed. RTO to [facility] in 6wks for follow up. Rx percocet and ibuprofen.  Pt to continue with Procardia XL 30mg  qd until follow up visit.    LOS: 3 days   Jabarie Pop O. 01/17/2011, 11:53 AM

## 2011-01-19 ENCOUNTER — Encounter (HOSPITAL_COMMUNITY): Payer: Self-pay | Admitting: Obstetrics and Gynecology

## 2011-01-22 ENCOUNTER — Ambulatory Visit (HOSPITAL_COMMUNITY)
Admission: RE | Admit: 2011-01-22 | Discharge: 2011-01-22 | Disposition: A | Discharge status: Home / Self Care | Payer: Medicaid Other | Source: Ambulatory Visit | Attending: Obstetrics and Gynecology | Admitting: Obstetrics and Gynecology

## 2011-01-22 NOTE — Progress Notes (Signed)
Adult Lactation Consultation Outpatient Visit Note  Patient Name: Sarah Mcdowell    BABY: Sarah Mcdowell Date of Birth: 07-Dec-1975                        DOB [birth year]  BIRTH WEIGHT 7-13 Gestational Age at Delivery: [redacted]w[redacted]d    WEIGHT TODAY:  8-2 Type of Delivery: NSVD  Breastfeeding History: Frequency of Breastfeeding: ATTEMPTS EVERY 3 HOURS Length of Feeding:  Voids: QS Stools: QS  Supplementing / Method:BOTTLE/EBM 2-3 OZ EVERY 3 HOURS Pumping:  Type of Pump:DEBP FORM WIC   Frequency:EVERY 3 HOURS  Volume:  60-120 MLS  Comments:    Consultation Evaluation:MOTHER HERE WITH C/O DIFFICULT LATCH SINCE BIRTH.  MOTHER HAS LARGE BREASTS.  NIPPLES ERECT AND AREOLA EASILY COMPRESSED.  DEMONSTRATED AND ASSISTED WITH TECHNIQUES FOR DEEP LATCH.  BABY LATCHED EASILY ONCE BREAST WAS SANDWICHED WELL.  BABY NURSED ON RIGHT BREAST AND 10 MIN ON LEFT BREAST.  GOOD BREAST SOFTENING AND AUDIBLE SWALLOWS.  BASIC TEACHING REINFORCED.  ENCOURAGED TO CALL LC OFFICE PRN.  Initial Feeding Assessment: Pre-feed Weight: Post-feed Weight: Amount Transferred: Comments:  Additional Feeding Assessment: Pre-feed Weight: Post-feed Weight: Amount Transferred: Comments:  Additional Feeding Assessment: Pre-feed Weight: Post-feed Weight: Amount Transferred: Comments:  Total Breast milk Transferred this Visit:  Total Supplement Given:   Additional Interventions:   Follow-UpCALL LC PRN      Hansel Feinstein 01/22/2011, 5:31 PM

## 2011-08-10 ENCOUNTER — Ambulatory Visit: Payer: Self-pay | Admitting: Obstetrics and Gynecology

## 2012-03-30 ENCOUNTER — Ambulatory Visit (INDEPENDENT_AMBULATORY_CARE_PROVIDER_SITE_OTHER): Payer: BC Managed Care – PPO | Admitting: Obstetrics and Gynecology

## 2012-03-30 ENCOUNTER — Encounter: Payer: Self-pay | Admitting: Obstetrics and Gynecology

## 2012-03-30 VITALS — BP 120/64 | HR 72 | Resp 16 | Ht 67.0 in | Wt 246.0 lb

## 2012-03-30 DIAGNOSIS — Z01419 Encounter for gynecological examination (general) (routine) without abnormal findings: Secondary | ICD-10-CM

## 2012-03-30 DIAGNOSIS — Z124 Encounter for screening for malignant neoplasm of cervix: Secondary | ICD-10-CM

## 2012-03-30 MED ORDER — VALACYCLOVIR HCL 500 MG PO TABS: 500.0000 mg | ORAL_TABLET | Freq: Two times a day (BID) | ORAL | Status: DC

## 2012-03-30 NOTE — Progress Notes (Signed)
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  Filed Vitals:   03/30/12 1507  BP: 120/64  Pulse: 72  Resp: 16   ROS: noncontributory  Physical Examination: General appearance - alert, well appearing, and in no distress Neck - supple, no significant adenopathy Chest - clear to auscultation, no wheezes, rales or rhonchi, symmetric air entry Heart - normal rate and regular rhythm Abdomen - soft, nontender, nondistended, no masses or organomegaly Breasts - breasts appear normal, no suspicious masses, no skin or nipple changes or axillary nodes Pelvic - normal external genitalia, vulva, vagina, cervix, uterus and adnexa Back exam - no CVAT Extremities - no edema, redness or tenderness in the calves or thighs  A/P Rx Valtrex Pap AEX 37yr Refer to Dr. Allyne Gee, pt needs a PCP

## 2012-03-31 LAB — PAP IG W/ RFLX HPV ASCU

## 2012-08-16 ENCOUNTER — Ambulatory Visit (INDEPENDENT_AMBULATORY_CARE_PROVIDER_SITE_OTHER): Payer: BC Managed Care – PPO | Admitting: Family

## 2012-08-16 ENCOUNTER — Encounter: Payer: Self-pay | Admitting: Family

## 2012-08-16 VITALS — BP 130/88 | HR 86 | Ht 67.0 in | Wt 248.0 lb

## 2012-08-16 DIAGNOSIS — G43909 Migraine, unspecified, not intractable, without status migrainosus: Secondary | ICD-10-CM

## 2012-08-16 DIAGNOSIS — M25579 Pain in unspecified ankle and joints of unspecified foot: Secondary | ICD-10-CM

## 2012-08-16 DIAGNOSIS — R635 Abnormal weight gain: Secondary | ICD-10-CM

## 2012-08-16 DIAGNOSIS — M25572 Pain in left ankle and joints of left foot: Secondary | ICD-10-CM

## 2012-08-16 MED ORDER — SUMATRIPTAN SUCCINATE 50 MG PO TABS: 50.0000 mg | ORAL_TABLET | ORAL | Status: DC | PRN

## 2012-08-16 NOTE — Patient Instructions (Addendum)
Migraine Headache A migraine headache is an intense, throbbing pain on one or both sides of your head. A migraine can last for 30 minutes to several hours. CAUSES  The exact cause of a migraine headache is not always known. However, a migraine may be caused when nerves in the brain become irritated and release chemicals that cause inflammation. This causes pain. SYMPTOMS  Pain on one or both sides of your head.  Pulsating or throbbing pain.  Severe pain that prevents daily activities.  Pain that is aggravated by any physical activity.  Nausea, vomiting, or both.  Dizziness.  Pain with exposure to bright lights, loud noises, or activity.  General sensitivity to bright lights, loud noises, or smells. Before you get a migraine, you may get warning signs that a migraine is coming (aura). An aura may include:  Seeing flashing lights.  Seeing bright spots, halos, or zig-zag lines.  Having tunnel vision or blurred vision.  Having feelings of numbness or tingling.  Having trouble talking.  Having muscle weakness. MIGRAINE TRIGGERS  Alcohol.  Smoking.  Stress.  Menstruation.  Aged cheeses.  Foods or drinks that contain nitrates, glutamate, aspartame, or tyramine.  Lack of sleep.  Chocolate.  Caffeine.  Hunger.  Physical exertion.  Fatigue.  Medicines used to treat chest pain (nitroglycerine), birth control pills, estrogen, and some blood pressure medicines. DIAGNOSIS  A migraine headache is often diagnosed based on:  Symptoms.  Physical examination.  A CT scan or MRI of your head. TREATMENT Medicines may be given for pain and nausea. Medicines can also be given to help prevent recurrent migraines.  HOME CARE INSTRUCTIONS  Only take over-the-counter or prescription medicines for pain or discomfort as directed by your caregiver. The use of long-term narcotics is not recommended.  Lie down in a dark, quiet room when you have a migraine.  Keep a journal  to find out what may trigger your migraine headaches. For example, write down:  What you eat and drink.  How much sleep you get.  Any change to your diet or medicines.  Limit alcohol consumption.  Quit smoking if you smoke.  Get 7 to 9 hours of sleep, or as recommended by your caregiver.  Limit stress.  Keep lights dim if bright lights bother you and make your migraines worse. SEEK IMMEDIATE MEDICAL CARE IF:   Your migraine becomes severe.  You have a fever.  You have a stiff neck.  You have vision loss.  You have muscular weakness or loss of muscle control.  You start losing your balance or have trouble walking.  You feel faint or pass out.  You have severe symptoms that are different from your first symptoms. MAKE SURE YOU:   Understand these instructions.  Will watch your condition.  Will get help right away if you are not doing well or get worse. Document Released: 03/09/2005 Document Revised: 06/01/2011 Document Reviewed: 02/27/2011 [employer] Patient Information 2014 [employer].  
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
## 2012-08-19 ENCOUNTER — Other Ambulatory Visit (INDEPENDENT_AMBULATORY_CARE_PROVIDER_SITE_OTHER): Payer: BC Managed Care – PPO

## 2012-08-19 DIAGNOSIS — Z0000 Encounter for general adult medical examination without abnormal findings: Secondary | ICD-10-CM

## 2012-08-19 LAB — HEPATIC FUNCTION PANEL: Albumin: 3.7 g/dL (ref 3.5–5.2)

## 2012-08-19 LAB — BASIC METABOLIC PANEL
CO2: 29 mEq/L (ref 19–32)
Calcium: 9.1 mg/dL (ref 8.4–10.5)
Glucose, Bld: 83 mg/dL (ref 70–99)
Sodium: 137 mEq/L (ref 135–145)

## 2012-08-19 LAB — CBC WITH DIFFERENTIAL/PLATELET
Basophils Absolute: 0 10*3/uL (ref 0.0–0.1)
Eosinophils Absolute: 0.1 10*3/uL (ref 0.0–0.7)
HCT: 38.4 % (ref 36.0–46.0)
Hemoglobin: 12.3 g/dL (ref 12.0–15.0)
Lymphs Abs: 2.6 10*3/uL (ref 0.7–4.0)
MCHC: 32.1 g/dL (ref 30.0–36.0)
Neutro Abs: 2 10*3/uL (ref 1.4–7.7)
RDW: 15.1 % — ABNORMAL HIGH (ref 11.5–14.6)

## 2012-08-19 LAB — POCT URINALYSIS DIPSTICK
Glucose, UA: NEGATIVE
Nitrite, UA: NEGATIVE
Urobilinogen, UA: 0.2

## 2012-08-19 LAB — LIPID PANEL
Cholesterol: 201 mg/dL — ABNORMAL HIGH (ref 0–200)
Triglycerides: 102 mg/dL (ref 0.0–149.0)

## 2012-08-19 LAB — TSH: TSH: 1 u[IU]/mL (ref 0.35–5.50)

## 2012-08-19 LAB — LDL CHOLESTEROL, DIRECT: Direct LDL: 128.4 mg/dL

## 2012-08-29 ENCOUNTER — Ambulatory Visit (INDEPENDENT_AMBULATORY_CARE_PROVIDER_SITE_OTHER): Payer: BC Managed Care – PPO | Admitting: Family

## 2012-08-29 ENCOUNTER — Encounter: Payer: Self-pay | Admitting: Family

## 2012-08-29 VITALS — BP 120/86 | HR 71 | Ht 67.0 in | Wt 252.0 lb

## 2012-08-29 DIAGNOSIS — Z0000 Encounter for general adult medical examination without abnormal findings: Secondary | ICD-10-CM

## 2012-08-29 DIAGNOSIS — E669 Obesity, unspecified: Secondary | ICD-10-CM

## 2012-08-29 MED ORDER — PHENTERMINE HCL 37.5 MG PO CAPS: 37.5000 mg | ORAL_CAPSULE | ORAL | Status: DC

## 2012-08-29 NOTE — Patient Instructions (Addendum)
Fat and Cholesterol Control Diet Cholesterol levels in your body are determined significantly by your diet. Cholesterol levels may also be related to heart disease. The following material helps to explain this relationship and discusses what you can do to help keep your heart healthy. Not all cholesterol is bad. Low-density lipoprotein (LDL) cholesterol is the "bad" cholesterol. It may cause fatty deposits to build up inside your arteries. High-density lipoprotein (HDL) cholesterol is "good." It helps to remove the "bad" LDL cholesterol from your blood. Cholesterol is a very important risk factor for heart disease. Other risk factors are high blood pressure, smoking, stress, heredity, and weight. The heart muscle gets its supply of blood through the coronary arteries. If your LDL cholesterol is high and your HDL cholesterol is low, you are at risk for having fatty deposits build up in your coronary arteries. This leaves less room through which blood can flow. Without sufficient blood and oxygen, the heart muscle cannot function properly and you may feel chest pains (angina pectoris). When a coronary artery closes up entirely, a part of the heart muscle may die causing a heart attack (myocardial infarction). CHECKING CHOLESTEROL When your caregiver sends your blood to a lab to be examined for cholesterol, a complete lipid (fat) profile may be done. With this test, the total amount of cholesterol and levels of LDL and HDL are determined. Triglycerides are a type of fat that circulates in the blood. They can also be used to determine heart disease risk. The list below describes what the numbers should be: Test: Total Cholesterol.  Less than 200 mg/dl. Test: LDL "bad cholesterol."  Less than 100 mg/dl.  Less than 70 mg/dl if you are at very high risk of a heart attack or sudden cardiac death. Test: HDL "good cholesterol."  Greater than 50 mg/dl for women.  Greater than 40 mg/dl for men. Test:  Triglycerides.  Less than 150 mg/dl. CONTROLLING CHOLESTEROL WITH DIET Although exercise and lifestyle factors are important, your diet is key. That is because certain foods are known to raise cholesterol and others to lower it. The goal is to balance foods for their effect on cholesterol and more importantly, to replace saturated and trans fat with other types of fat, such as monounsaturated fat, polyunsaturated fat, and omega-3 fatty acids. On average, a person should consume no more than 15 to 17 g of saturated fat daily. Saturated and trans fats are considered "bad" fats, and they will raise LDL cholesterol. Saturated fats are primarily found in animal products such as meats, butter, and cream. However, that does not mean you need to give up all your favorite foods. Today, there are good tasting, low-fat, low-cholesterol substitutes for most of the things you like to eat. Choose low-fat or nonfat alternatives. Choose round or loin cuts of red meat. These types of cuts are lowest in fat and cholesterol. Chicken (without the skin), fish, veal, and ground turkey breast are great choices. Eliminate fatty meats, such as hot dogs and salami. Even shellfish have little or no saturated fat. Have a 3 oz (85 g) portion when you eat lean meat, poultry, or fish. Trans fats are also called "partially hydrogenated oils." They are oils that have been scientifically manipulated so that they are solid at room temperature resulting in a longer shelf life and improved taste and texture of foods in which they are added. Trans fats are found in stick margarine, some tub margarines, cookies, crackers, and baked goods.  When baking and cooking, oils   are a great substitute for butter. The monounsaturated oils are especially beneficial since it is believed they lower LDL and raise HDL. The oils you should avoid entirely are saturated tropical oils, such as coconut and palm.  Remember to eat a lot from food groups that are  naturally free of saturated and trans fat, including fish, fruit, vegetables, beans, grains (barley, rice, couscous, bulgur wheat), and pasta (without cream sauces).  IDENTIFYING FOODS THAT LOWER CHOLESTEROL  Soluble fiber may lower your cholesterol. This type of fiber is found in fruits such as apples, vegetables such as broccoli, potatoes, and carrots, legumes such as beans, peas, and lentils, and grains such as barley. Foods fortified with plant sterols (phytosterol) may also lower cholesterol. You should eat at least 2 g per day of these foods for a cholesterol lowering effect.  Read package labels to identify low-saturated fats, trans fat free, and low-fat foods at the supermarket. Select cheeses that have only 2 to 3 g saturated fat per ounce. Use a heart-healthy tub margarine that is free of trans fats or partially hydrogenated oil. When buying baked goods (cookies, crackers), avoid partially hydrogenated oils. Breads and muffins should be made from whole grains (whole-wheat or whole oat flour, instead of "flour" or "enriched flour"). Buy non-creamy canned soups with reduced salt and no added fats.  FOOD PREPARATION TECHNIQUES  Never deep-fry. If you must fry, either stir-fry, which uses very little fat, or use non-stick cooking sprays. When possible, broil, bake, or roast meats, and steam vegetables. Instead of putting butter or margarine on vegetables, use lemon and herbs, applesauce, and Jleigh (for squash and sweet potatoes), nonfat yogurt, salsa, and low-fat dressings for salads.  LOW-SATURATED FAT / LOW-FAT FOOD SUBSTITUTES Meats / Saturated Fat (g)  Avoid: Steak, marbled (3 oz/85 g) / 11 g  Choose: Steak, lean (3 oz/85 g) / 4 g  Avoid: Hamburger (3 oz/85 g) / 7 g  Choose: Hamburger, lean (3 oz/85 g) / 5 g  Avoid: Ham (3 oz/85 g) / 6 g  Choose: Ham, lean cut (3 oz/85 g) / 2.4 g  Avoid: Chicken, with skin, dark meat (3 oz/85 g) / 4 g  Choose: Chicken, skin removed, dark meat (3  oz/85 g) / 2 g  Avoid: Chicken, with skin, light meat (3 oz/85 g) / 2.5 g  Choose: Chicken, skin removed, light meat (3 oz/85 g) / 1 g Dairy / Saturated Fat (g)  Avoid: Whole milk (1 cup) / 5 g  Choose: Low-fat milk, 2% (1 cup) / 3 g  Choose: Low-fat milk, 1% (1 cup) / 1.5 g  Choose: Skim milk (1 cup) / 0.3 g  Avoid: Hard cheese (1 oz/28 g) / 6 g  Choose: Skim milk cheese (1 oz/28 g) / 2 to 3 g  Avoid: Cottage cheese, 4% fat (1 cup) / 6.5 g  Choose: Low-fat cottage cheese, 1% fat (1 cup) / 1.5 g  Avoid: Ice cream (1 cup) / 9 g  Choose: Sherbet (1 cup) / 2.5 g  Choose: Nonfat frozen yogurt (1 cup) / 0.3 g  Choose: Frozen fruit bar / trace  Avoid: Whipped cream (1 tbs) / 3.5 g  Choose: Nondairy whipped topping (1 tbs) / 1 g Condiments / Saturated Fat (g)  Avoid: Mayonnaise (1 tbs) / 2 g  Choose: Low-fat mayonnaise (1 tbs) / 1 g  Avoid: Butter (1 tbs) / 7 g  Choose: Extra light margarine (1 tbs) / 1 g  Avoid: Coconut oil (1   tbs) / 11.8 g  Choose: Olive oil (1 tbs) / 1.8 g  Choose: Corn oil (1 tbs) / 1.7 g  Choose: Safflower oil (1 tbs) / 1.2 g  Choose: Sunflower oil (1 tbs) / 1.4 g  Choose: Soybean oil (1 tbs) / 2.4 g  Choose: Canola oil (1 tbs) / 1 g Document Released: 03/09/2005 Document Revised: 06/01/2011 Document Reviewed: 08/28/2010 [employer] Patient Information 2014 [place], [place].  Exercise to Lose Weight Exercise and a healthy diet may help you lose weight. Your doctor may suggest specific exercises. EXERCISE IDEAS AND TIPS  Choose low-cost things you enjoy doing, such as walking, bicycling, or exercising to workout videos.  Take stairs instead of the elevator.  Walk during your lunch break.  Park your car further away from work or school.  Go to a gym or an exercise class.  Start with 5 to 10 minutes of exercise each day. Build up to 30 minutes of exercise 4 to 6 days a week.  Wear shoes with good support and comfortable  clothes.  Stretch before and after working out.  Work out until you breathe harder and your heart beats faster.  Drink extra water when you exercise.  Do not do so much that you hurt yourself, feel dizzy, or get very short of breath. Exercises that burn about 150 calories:  Running 1  miles in 15 minutes.  Playing volleyball for 45 to 60 minutes.  Washing and waxing a car for 45 to 60 minutes.  Playing touch football for 45 minutes.  Walking 1  miles in 35 minutes.  Pushing a stroller 1  miles in 30 minutes.  Playing basketball for 30 minutes.  Raking leaves for 30 minutes.  Bicycling 5 miles in 30 minutes.  Walking 2 miles in 30 minutes.  Dancing for 30 minutes.  Shoveling snow for 15 minutes.  Swimming laps for 20 minutes.  Walking up stairs for 15 minutes.  Bicycling 4 miles in 15 minutes.  Gardening for 30 to 45 minutes.  Jumping rope for 15 minutes.  Washing windows or floors for 45 to 60 minutes. Document Released: 04/11/2010 Document Revised: 06/01/2011 Document Reviewed: 04/11/2010 [facility] Patient Information 2014 [place], [place].

## 2012-08-30 ENCOUNTER — Encounter: Payer: Self-pay | Admitting: Family
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
## 2012-09-28 ENCOUNTER — Ambulatory Visit: Payer: BC Managed Care – PPO | Admitting: Family

## 2012-10-11 ENCOUNTER — Ambulatory Visit: Payer: BC Managed Care – PPO | Admitting: Family

## 2012-12-29 ENCOUNTER — Encounter: Payer: Self-pay | Admitting: Internal Medicine

## 2012-12-29 ENCOUNTER — Ambulatory Visit (INDEPENDENT_AMBULATORY_CARE_PROVIDER_SITE_OTHER): Payer: BC Managed Care – PPO | Admitting: Internal Medicine

## 2012-12-29 VITALS — BP 126/80 | Temp 98.5°F | Wt 236.0 lb

## 2012-12-29 DIAGNOSIS — R519 Headache, unspecified: Secondary | ICD-10-CM

## 2012-12-29 DIAGNOSIS — R51 Headache: Secondary | ICD-10-CM

## 2012-12-29 HISTORY — DX: Headache, unspecified: R51.9

## 2012-12-29 MED ORDER — ALPRAZOLAM 0.25 MG PO TABS: ORAL_TABLET | ORAL | Status: DC

## 2012-12-29 MED ORDER — PROMETHAZINE HCL 50 MG/ML IJ SOLN
25.0000 mg | Freq: Once | INTRAMUSCULAR | Status: AC
  Administered 2012-12-29: 25 mg via INTRAMUSCULAR

## 2012-12-29 MED ORDER — FROVATRIPTAN SUCCINATE 2.5 MG PO TABS: 2.5000 mg | ORAL_TABLET | ORAL | Status: DC | PRN

## 2012-12-29 MED ORDER — PROMETHAZINE HCL 12.5 MG PO TABS: 12.5000 mg | ORAL_TABLET | Freq: Three times a day (TID) | ORAL | Status: DC | PRN

## 2012-12-29 NOTE — Patient Instructions (Addendum)
Follow up with your Gynecologist regarding starting low-dose birth control to help control migraines Take ibuprofen 600 mg when you get home. Our office will call you re: scheduling MRI of Brain When you get your next severe migraine, take Frova 2.5 mg and over the counter Aleve (1 tablet) at the same time.
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
## 2012-12-29 NOTE — Assessment & Plan Note (Signed)
37 year old [place] American female presents with right-sided headache. Although she has history of migraines, patient experienced headache last night that was the most severe she has ever experienced. 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  Arrange MRI of brain without contrast.  Patient given Phenergan 25 mg IM x 1. She was instructed to take ibuprofen 600 mg once she gets home.  Discontinued Imitrex as it is ineffective. Trial of Frova 2.5 mg. Patient instructed to take Frova as well as 1 over-the-counter Aleve with her next severe migraine headache.  Her headaches worse with her menstrual cycle. Patient advised to followup with her gynecologist for possible trial of low-dose birth control pills.

## 2013-01-05 ENCOUNTER — Ambulatory Visit
Admission: RE | Admit: 2013-01-05 | Discharge: 2013-01-05 | Disposition: A | Discharge status: Home / Self Care | Payer: BC Managed Care – PPO | Source: Ambulatory Visit | Attending: Internal Medicine | Admitting: Internal Medicine

## 2013-01-11 ENCOUNTER — Telehealth: Payer: Self-pay | Admitting: Family

## 2013-01-11 NOTE — Telephone Encounter (Signed)
Pt was not successful w/ her MRI.  Pt was given a xanax but that didn't help. They advised pt to contact pcp for other options. Pt is still having her headaches, but this week has been good. Pt would like a referral to an allergy md.  Pt saw Dr Artist Pais for her headaches. . Pt would like to know if she should come in and speak w/ you, and start all over w/ this process.  pls advise.

## 2013-01-11 NOTE — Telephone Encounter (Signed)
Please advise 

## 2013-01-12 NOTE — Telephone Encounter (Signed)
Left message for pt to call back  °

## 2013-01-12 NOTE — Telephone Encounter (Signed)
I would be glad to give her valium and try the MRI again. Headaches her been persistent so we probably should scan head. Let me know what she wants to do.

## 2013-01-13 NOTE — Telephone Encounter (Signed)
Pt would like to try the valium and will call to reschedule appointment for MRI

## 2013-01-13 NOTE — Telephone Encounter (Signed)
Pt will call back in 15 mins

## 2013-01-15 ENCOUNTER — Other Ambulatory Visit: Payer: BC Managed Care – PPO

## 2013-01-16 MED ORDER — DIAZEPAM 10 MG PO TABS: 10.0000 mg | ORAL_TABLET | ORAL | Status: DC | PRN

## 2013-01-16 NOTE — Telephone Encounter (Signed)
Please phone in.. Valium, 10mg  1 tab 10 min prior to procedure. #5 no refills.

## 2013-01-17 NOTE — Telephone Encounter (Signed)
Rx phoned in and message left to advise pt

## 2014-01-22 ENCOUNTER — Encounter: Payer: Self-pay | Admitting: Internal Medicine

## 2014-06-06 ENCOUNTER — Ambulatory Visit (INDEPENDENT_AMBULATORY_CARE_PROVIDER_SITE_OTHER): Payer: BC Managed Care – PPO | Admitting: Family Medicine

## 2014-06-06 ENCOUNTER — Encounter: Payer: Self-pay | Admitting: Family Medicine

## 2014-06-06 VITALS — BP 132/92 | HR 76 | Temp 98.1°F | Ht 67.0 in | Wt 243.0 lb

## 2014-06-06 DIAGNOSIS — J209 Acute bronchitis, unspecified: Secondary | ICD-10-CM

## 2014-06-06 MED ORDER — AZITHROMYCIN 250 MG PO TABS: ORAL_TABLET | ORAL | Status: DC

## 2014-06-06 NOTE — Progress Notes (Signed)
   Subjective:    Patient ID: Sarah Mcdowell, female    DOB: 27-Dec-1975, 39 y.o.   MRN: [id number]  HPI Here for 4 days of stuffy head, PND, chest congestion and a dry cough.    Review of Systems  Constitutional: Negative.   HENT: Positive for congestion and postnasal drip. Negative for sinus pressure.   Eyes: Negative.   Respiratory: Positive for cough.        Objective:   Physical Exam  Constitutional: She appears well-developed and well-nourished.  HENT:  Right Ear: External ear normal.  Left Ear: External ear normal.  Nose: Nose normal.  Mouth/Throat: Oropharynx is clear and moist.  Eyes: Conjunctivae are normal.  Pulmonary/Chest: Effort normal. No respiratory distress. She has no wheezes. She has no rales.  Scattered rhonchi   Lymphadenopathy:    She has no cervical adenopathy.          Assessment & Plan:  Add Delsym prn. Written out of work today and tomorrow

## 2014-06-06 NOTE — Progress Notes (Signed)
Pre visit review using our clinic review tool, if applicable. No additional management support is needed unless otherwise documented below in the visit note. 

## 2014-09-10 ENCOUNTER — Ambulatory Visit (INDEPENDENT_AMBULATORY_CARE_PROVIDER_SITE_OTHER): Payer: BC Managed Care – PPO | Admitting: Family Medicine

## 2014-09-10 DIAGNOSIS — R69 Illness, unspecified: Secondary | ICD-10-CM

## 2014-09-10 NOTE — Progress Notes (Signed)
NO SHOW for NPV

## 2014-09-13 ENCOUNTER — Ambulatory Visit (INDEPENDENT_AMBULATORY_CARE_PROVIDER_SITE_OTHER): Payer: BC Managed Care – PPO | Admitting: Family Medicine

## 2014-09-13 ENCOUNTER — Encounter: Payer: Self-pay | Admitting: Family Medicine

## 2014-09-13 ENCOUNTER — Telehealth: Payer: Self-pay | Admitting: Family

## 2014-09-13 VITALS — BP 140/80 | HR 78 | Temp 98.5°F | Wt 252.0 lb

## 2014-09-13 DIAGNOSIS — R51 Headache: Secondary | ICD-10-CM

## 2014-09-13 DIAGNOSIS — G43711 Chronic migraine without aura, intractable, with status migrainosus: Secondary | ICD-10-CM

## 2014-09-13 DIAGNOSIS — G5601 Carpal tunnel syndrome, right upper limb: Secondary | ICD-10-CM

## 2014-09-13 DIAGNOSIS — R200 Anesthesia of skin: Secondary | ICD-10-CM

## 2014-09-13 DIAGNOSIS — R202 Paresthesia of skin: Secondary | ICD-10-CM | POA: Diagnosis not present

## 2014-09-13 DIAGNOSIS — R519 Headache, unspecified: Secondary | ICD-10-CM

## 2014-09-13 NOTE — Progress Notes (Signed)
Garret Reddish, MD  Subjective:  Sarah Mcdowell is a 39 y.o. year old very pleasant female patient who presents with:  Severe headaches/migraines Reports history of migraines. Severe headaches around menstruation typically. 2-3 days ibuprofen 800mg  a month. Usually able to take 800mg  ibuprofen and rest and resolve. Was seen 12/29/12 by Dr. Shawna Orleans for severe headache unlike prior migraines. For this reason, she was set up for MRI. Xanax prior to first MRI and failed to be able to complete. Took valium 10mg  MRI and failed to be able to complete. She is so anxious states she could not return. She has a history of taking imitrex which didn't help before. Frova did not help either.   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 That was probably her worst day.  4 days with headache. 7-8/10 at its worst, lowest it gets is down to 3-4. Right frontal. She is surprised because she is not on her period. She had a very short period while driving probably less than a minute where she had some R eye blurriness. She also noted one of the morning she woke up that she had some mild pain and numbness into her right hand. Worse in the morning but also could occur with work. Numbness did not resolve with ibuprofen.   She has taken 800mg  ibuprofen for 4 consecutive days and rested and only mild relief of headache. The hand numbness caused her to present to care for potential CVA.   ROS- No slurred speech, blurry vision (other than temporary issue ntoed above), trouble swallowing, extremity weakness  Past Medical History- elevated blood pressure, migraines, obesity, fibroids  Medications- reviewed and updated Current Outpatient Prescriptions  Medication Sig Dispense Refill  . Multiple Vitamin (MULTIVITAMIN) capsule Take 1 capsule by mouth daily.    . valACYclovir (VALTREX) 500 MG tablet   Only with outbreaks- genital Take 1 tablet (500 mg total) by mouth 2 (two) times daily. (Patient not taking:  Reported on 06/06/2014) 6 tablet 5   Objective: BP 140/80 mmHg  Pulse 78  Temp(Src) 98.5 F (36.9 C)  Wt 252 lb (114.306 kg) Gen: NAD, resting comfortably in bed HEENT:  NCAT. PERRLA.  CV: RRR no mrg  Lungs: CTAB  Abd: soft/nontender/nondistended/normal bowel sounds  MSK: moves all extremities, no edema  Skin: warm and dry, no rash  Neuro: CN II-XII intact, sensation and reflexes normal throughout, 5/5 muscle strength in bilateral upper and lower extremities. Normal finger to nose. Normal rapid alternating movements. Normal romberg. No pronator drift. Normal gait.   Tinel's and Phalen's positive   Assessment/Plan:  Severe headaches/migraines- likely due to more persistent migraine Right hand numbness (first 3.5 fingers)- due to carpal tunnel Referred patient to headache wellness clinic. I told patient I thought she still needed MRI and she became very anxious about idea. She would likely need more intensive sedation to be able to achieve this vs. Alternate imaging. i think headaches are typical of migraine's but headache clinic should be able to see if they think symptoms truly warrant imaging.   Desired to give patient toradol and phenergan in office but she had already taken 800mg  ibuprofen today. We discussed if headache continues into tomorrow, I will work her in and give both of the above (but to avoid ibuprofen) then plan to go home and try to rest/sleep it off. She may need an ED visit for more intensive therapies if that does not work  In regards to hand numbness, i could  reproduce this with tinel's and phalen's and neuro exam otherwise normal and did not think she needed immediate further eval. CVA much lower on differential than carpal tunnel plus typical migraine pattern.   She is going to be establish care with Dr. Etter Sjogren. Does not want to follow up in this office as wants several years of experience in her provider. Told patient we would be more than happy to provide care for  her during this transition.   Orders Placed This Encounter  Procedures  . AMB referral to headache clinic    Referral Priority:  Routine    Referral Type:  Consultation    Number of Visits Requested:  1

## 2014-09-13 NOTE — Telephone Encounter (Signed)
Patient Name: Sarah Mcdowell  DOB: 1975/06/06    Initial Comment Caller states she has had a headache for 4 days. Also has numbness in her right hand   Nurse Assessment  Nurse: Raphael Gibney, RN, Vanita Ingles Date/Time (Eastern Time): 09/13/2014 12:30:18 PM  Confirm and document reason for call. If symptomatic, describe symptoms. ---Caller states she has had a headache for 4 days History of migraines. Headaches have been tolerable but headache has gotten worse. Right hand is numb. Has had numbness for past 2-3 days. Pain level 7. She is nauseated. no fever.  Has the patient traveled out of the country within the last 30 days? ---No  Does the patient require triage? ---Yes  Related visit to physician within the last 2 weeks? ---No  Does the PT have any chronic conditions? (i.e. diabetes, asthma, etc.) ---No  Did the patient indicate they were pregnant? ---No     Guidelines    Guideline Title Affirmed Question Affirmed Notes  Headache [1] MODERATE headache (e.g., interferes with normal activities) AND [2] present > 24 hours AND [3] unexplained (Exceptions: analgesics not tried, typical migraine, or headache part of viral illness)    Final Disposition User   See Physician within 24 Hours Raphael Gibney, RN, Vera    Appt made for 09/13/2014 at 4:15 with Dr. Garret Reddish.

## 2014-09-13 NOTE — Patient Instructions (Signed)
We will call you within a week about your referral to Headache wellness center. If you do not hear within 1 week, give [place] a call.   I do think MRI is a good idea but given you have had issues with this, we will get headache's opinion on this and see if they have other options.   I do not think your hand numbness is related to your headache. This seems like carpal tunnel. I would ice the wrist for 5-10 minutes twice a day and use a cock up wrist splint for this.   If you are still having headaches tomorrow, get an appointment with me (we will work you in- refer to the AVS if scheduling gives you any issues). I will plan on a toradol shot (do not take ibuprofen tomorrow) and nausea shot to help you sleep as well. If this didn't help, may require ER visit as they have other meds available that we dont have.

## 2014-10-04 ENCOUNTER — Telehealth: Payer: Self-pay | Admitting: *Deleted

## 2014-10-04 ENCOUNTER — Encounter: Payer: Self-pay | Admitting: *Deleted

## 2014-10-04 NOTE — Telephone Encounter (Signed)
Pre-Visit Call completed with patient and chart updated.   Pre-Visit Info documented in Specialty Comments under [employer].    

## 2014-10-05 ENCOUNTER — Ambulatory Visit (INDEPENDENT_AMBULATORY_CARE_PROVIDER_SITE_OTHER): Payer: BC Managed Care – PPO | Admitting: Family Medicine

## 2014-10-05 ENCOUNTER — Encounter: Payer: Self-pay | Admitting: Family Medicine

## 2014-10-05 VITALS — BP 131/88 | HR 81 | Temp 98.1°F | Resp 18 | Ht 67.0 in | Wt 252.4 lb

## 2014-10-05 DIAGNOSIS — G43109 Migraine with aura, not intractable, without status migrainosus: Secondary | ICD-10-CM | POA: Diagnosis not present

## 2014-10-05 DIAGNOSIS — L680 Hirsutism: Secondary | ICD-10-CM

## 2014-10-05 DIAGNOSIS — Z8669 Personal history of other diseases of the nervous system and sense organs: Secondary | ICD-10-CM

## 2014-10-05 DIAGNOSIS — Z1239 Encounter for other screening for malignant neoplasm of breast: Secondary | ICD-10-CM | POA: Diagnosis not present

## 2014-10-05 DIAGNOSIS — J069 Acute upper respiratory infection, unspecified: Secondary | ICD-10-CM

## 2014-10-05 DIAGNOSIS — R030 Elevated blood-pressure reading, without diagnosis of hypertension: Secondary | ICD-10-CM

## 2014-10-05 DIAGNOSIS — IMO0001 Reserved for inherently not codable concepts without codable children: Secondary | ICD-10-CM

## 2014-10-05 HISTORY — DX: Morbid (severe) obesity due to excess calories: E66.01

## 2014-10-05 HISTORY — DX: Personal history of other diseases of the nervous system and sense organs: Z86.69

## 2014-10-05 MED ORDER — LORATADINE 10 MG PO TABS: 10.0000 mg | ORAL_TABLET | Freq: Every day | ORAL | Status: DC

## 2014-10-05 MED ORDER — TRIAMCINOLONE ACETONIDE 55 MCG/ACT NA AERO: 2.0000 | INHALATION_SPRAY | Freq: Every day | NASAL | Status: DC

## 2014-10-05 NOTE — Progress Notes (Signed)
Subjective:    Patient ID: Sarah Mcdowell, female    DOB: 1975/08/01, 39 y.o.   MRN: [id number]  HPI  Patient here to establish.  She has a hx of migraines , allergies and obesity.  She is struggling to lose weight and is looking for some help with this.   Past Medical History  Diagnosis Date  . Fibroids   . History of chlamydia 1992  . Hypertension   . History of cystitis   . Headache(784.0)     migraines  . AMA (advanced maternal age) multigravida 62+   . Obese   . Anemia   . Allergy     seasonal    Review of Systems  Constitutional: Negative for diaphoresis, appetite change, fatigue and unexpected weight change.  Eyes: Negative for pain, redness and visual disturbance.  Respiratory: Negative for cough, chest tightness, shortness of breath and wheezing.   Cardiovascular: Negative for chest pain, palpitations and leg swelling.  Endocrine: Negative for cold intolerance, heat intolerance, polydipsia, polyphagia and polyuria.  Genitourinary: Negative for dysuria, frequency and difficulty urinating.  Neurological: Negative for dizziness, light-headedness, numbness and headaches.  Psychiatric/Behavioral: Negative for dysphoric mood. The patient is not nervous/anxious.     Current Outpatient Prescriptions on File Prior to Visit  Medication Sig Dispense Refill  . Multiple Vitamin (MULTIVITAMIN) capsule Take 1 capsule by mouth daily.    . valACYclovir (VALTREX) 500 MG tablet Take 1 tablet (500 mg total) by mouth 2 (two) times daily. 6 tablet 5   No current facility-administered medications on file prior to visit.       Objective:    Physical Exam  Constitutional: She is oriented to person, place, and time. She appears well-developed and well-nourished.  HENT:  Head: Normocephalic and atraumatic.  Eyes: Conjunctivae and EOM are normal.  Neck: Normal range of motion. Neck supple. No JVD present. Carotid bruit is not present. No thyromegaly present.  Cardiovascular: Normal  rate, regular rhythm and normal heart sounds.   No murmur heard. Pulmonary/Chest: Effort normal and breath sounds normal. No respiratory distress. She has no wheezes. She has no rales. She exhibits no tenderness.  Musculoskeletal: She exhibits no edema.  Neurological: She is alert and oriented to person, place, and time.  Psychiatric: She has a normal mood and affect. Her behavior is normal.    BP 131/88 mmHg  Pulse 81  Temp(Src) 98.1 F (36.7 C) (Oral)  Resp 18  Ht 5\' 7"  (1.702 m)  Wt 252 lb 6.4 oz (114.488 kg)  BMI 39.52 kg/m2  SpO2 98%  LMP 09/05/2014 Wt Readings from Last 3 Encounters:  10/05/14 252 lb 6.4 oz (114.488 kg)  09/13/14 252 lb (114.306 kg)  06/06/14 243 lb (110.224 kg)     Lab Results  Component Value Date   WBC 5.5 08/19/2012   HGB 12.3 08/19/2012   HCT 38.4 08/19/2012   PLT 206.0 08/19/2012   GLUCOSE 83 08/19/2012   CHOL 201* 08/19/2012   TRIG 102.0 08/19/2012   HDL 43.40 08/19/2012   LDLDIRECT 128.4 08/19/2012   ALT 36* 08/19/2012   AST 24 08/19/2012   NA 137 08/19/2012   K 4.0 08/19/2012   CL 100 08/19/2012   CREATININE 0.8 08/19/2012   BUN 12 08/19/2012   CO2 29 08/19/2012   TSH 1.00 08/19/2012       Assessment & Plan:   Problem List Items Addressed This Visit    Obesity, morbid    Refer to [facility] weight loss program con't  to work on diet and exercise      Hx of migraines    Headache wellness referral pending       Other Visit Diagnoses    Elevated BP    -  Primary    Relevant Orders    Basic metabolic panel    CBC with Differential/Platelet    Hepatic function panel    Lipid panel    Microalbumin / creatinine urine ratio    POCT urinalysis dipstick    TSH    Breast cancer screening        Relevant Orders    MM Digital Screening    Migraine with aura and without status migrainosus, not intractable        Relevant Orders    Basic metabolic panel    CBC with Differential/Platelet    Hepatic function panel    Lipid panel     Microalbumin / creatinine urine ratio    POCT urinalysis dipstick    TSH    Morbid obesity        Relevant Orders    Amb ref to Medical Nutrition Therapy-MNT    Acute upper respiratory infection        Relevant Medications    triamcinolone (NASACORT AQ) 55 MCG/ACT AERO nasal inhaler    loratadine (CLARITIN) 10 MG tablet    Hirsutism        Relevant Orders    Testosterone, Free, Total, SHBG       I am having Sarah Mcdowell start on triamcinolone and loratadine. I am also having her maintain her multivitamin and valACYclovir.  Meds ordered this encounter  Medications  . triamcinolone (NASACORT AQ) 55 MCG/ACT AERO nasal inhaler    Sig: Place 2 sprays into the nose daily.    Dispense:  1 Inhaler    Refill:  12  . loratadine (CLARITIN) 10 MG tablet    Sig: Take 1 tablet (10 mg total) by mouth daily.    Dispense:  30 tablet    Refill:  [place], DO

## 2014-10-05 NOTE — Assessment & Plan Note (Signed)
Refer to [facility] weight loss program con't to work on diet and exercise

## 2014-10-05 NOTE — Progress Notes (Signed)
Pre visit review using our clinic review tool, if applicable. No additional management support is needed unless otherwise documented below in the visit note. 

## 2014-10-05 NOTE — Assessment & Plan Note (Signed)
Headache wellness referral pending

## 2014-10-05 NOTE — Patient Instructions (Signed)

## 2014-10-11 ENCOUNTER — Other Ambulatory Visit (INDEPENDENT_AMBULATORY_CARE_PROVIDER_SITE_OTHER): Payer: BC Managed Care – PPO

## 2014-10-11 DIAGNOSIS — R030 Elevated blood-pressure reading, without diagnosis of hypertension: Secondary | ICD-10-CM | POA: Diagnosis not present

## 2014-10-11 DIAGNOSIS — IMO0001 Reserved for inherently not codable concepts without codable children: Secondary | ICD-10-CM

## 2014-10-11 DIAGNOSIS — G43109 Migraine with aura, not intractable, without status migrainosus: Secondary | ICD-10-CM | POA: Diagnosis not present

## 2014-10-11 DIAGNOSIS — L680 Hirsutism: Secondary | ICD-10-CM

## 2014-10-11 LAB — HEPATIC FUNCTION PANEL
ALBUMIN: 3.9 g/dL (ref 3.5–5.2)
ALT: 23 U/L (ref 0–35)
AST: 19 U/L (ref 0–37)
Alkaline Phosphatase: 54 U/L (ref 39–117)
Bilirubin, Direct: 0.1 mg/dL (ref 0.0–0.3)
Total Bilirubin: 0.4 mg/dL (ref 0.2–1.2)
Total Protein: 7.3 g/dL (ref 6.0–8.3)

## 2014-10-11 LAB — CBC WITH DIFFERENTIAL/PLATELET
Basophils Absolute: 0 10*3/uL (ref 0.0–0.1)
Basophils Relative: 0.7 % (ref 0.0–3.0)
Eosinophils Absolute: 0.2 10*3/uL (ref 0.0–0.7)
Eosinophils Relative: 3 % (ref 0.0–5.0)
HEMATOCRIT: 38.5 % (ref 36.0–46.0)
Hemoglobin: 12.1 g/dL (ref 12.0–15.0)
LYMPHS PCT: 44.7 % (ref 12.0–46.0)
Lymphs Abs: 3.2 10*3/uL (ref 0.7–4.0)
MCHC: 31.3 g/dL (ref 30.0–36.0)
MCV: 72.1 fl — ABNORMAL LOW (ref 78.0–100.0)
Monocytes Absolute: 0.4 10*3/uL (ref 0.1–1.0)
Monocytes Relative: 5.6 % (ref 3.0–12.0)
NEUTROS PCT: 46 % (ref 43.0–77.0)
Neutro Abs: 3.3 10*3/uL (ref 1.4–7.7)
Platelets: 231 10*3/uL (ref 150.0–400.0)
RBC: 5.34 Mil/uL — ABNORMAL HIGH (ref 3.87–5.11)
RDW: 15.1 % (ref 11.5–15.5)
WBC: 7.1 10*3/uL (ref 4.0–10.5)

## 2014-10-11 LAB — BASIC METABOLIC PANEL
BUN: 13 mg/dL (ref 6–23)
CALCIUM: 9 mg/dL (ref 8.4–10.5)
CHLORIDE: 102 meq/L (ref 96–112)
CO2: 29 mEq/L (ref 19–32)
CREATININE: 0.85 mg/dL (ref 0.40–1.20)
GFR: 94.65 mL/min (ref >60.00)
Glucose, Bld: 88 mg/dL (ref 70–99)
POTASSIUM: 3.9 meq/L (ref 3.5–5.1)
SODIUM: 138 meq/L (ref 135–145)

## 2014-10-11 LAB — MICROALBUMIN / CREATININE URINE RATIO
CREATININE, U: 164.5 mg/dL
Microalb Creat Ratio: 0.4 mg/g (ref 0.0–30.0)
Microalb, Ur: <0.7 mg/dL (ref 0.0–1.9)

## 2014-10-11 LAB — LIPID PANEL
CHOL/HDL RATIO: 5
Cholesterol: 209 mg/dL — ABNORMAL HIGH (ref 0–200)
HDL: 42.3 mg/dL (ref >39.00)
LDL Cholesterol: 138 mg/dL — ABNORMAL HIGH (ref 0–99)
NonHDL: 166.7
Triglycerides: 142 mg/dL (ref 0.0–149.0)
VLDL: 28.4 mg/dL (ref 0.0–40.0)

## 2014-10-11 LAB — TSH: TSH: 1.71 u[IU]/mL (ref 0.35–4.50)

## 2014-10-12 LAB — TESTOSTERONE, FREE, TOTAL, SHBG
Sex Hormone Binding: 48 nmol/L (ref 17–124)
TESTOSTERONE FREE: 7.1 pg/mL — AB (ref 0.6–6.8)
TESTOSTERONE-% FREE: 1.4 % (ref 0.4–2.4)
Testosterone: 50 ng/dL (ref 10–70)

## 2015-08-21 ENCOUNTER — Encounter: Payer: Self-pay | Admitting: Medical

## 2015-08-21 ENCOUNTER — Ambulatory Visit (INDEPENDENT_AMBULATORY_CARE_PROVIDER_SITE_OTHER): Payer: BC Managed Care – PPO | Admitting: Medical

## 2015-08-21 VITALS — BP 128/80 | HR 78 | Temp 98.1°F | Ht 67.0 in | Wt 243.0 lb

## 2015-08-21 DIAGNOSIS — R51 Headache: Secondary | ICD-10-CM

## 2015-08-21 DIAGNOSIS — R1031 Right lower quadrant pain: Secondary | ICD-10-CM

## 2015-08-21 DIAGNOSIS — R030 Elevated blood-pressure reading, without diagnosis of hypertension: Secondary | ICD-10-CM | POA: Diagnosis not present

## 2015-08-21 DIAGNOSIS — IMO0001 Reserved for inherently not codable concepts without codable children: Secondary | ICD-10-CM

## 2015-08-21 DIAGNOSIS — R519 Headache, unspecified: Secondary | ICD-10-CM

## 2015-08-21 LAB — POC URINALSYSI DIPSTICK (AUTOMATED)
Bilirubin, UA: NEGATIVE
Glucose, UA: NEGATIVE
Ketones, UA: NEGATIVE
Leukocytes, UA: NEGATIVE
NITRITE UA: NEGATIVE
PH UA: 5
Protein, UA: NEGATIVE
RBC UA: NEGATIVE
UROBILINOGEN UA: 0.2

## 2015-08-21 LAB — POCT URINE PREGNANCY: PREG TEST UR: NEGATIVE

## 2015-08-21 MED ORDER — HYDROCHLOROTHIAZIDE 12.5 MG PO TABS: 12.5000 mg | ORAL_TABLET | Freq: Every day | ORAL | Status: DC

## 2015-08-21 NOTE — Addendum Note (Signed)
Addended by: Tasia Catchings on: 08/21/2015 04:55 PM   Modules accepted: Orders

## 2015-08-21 NOTE — Progress Notes (Signed)
Pre visit review using our clinic review tool, if applicable. No additional management support is needed unless otherwise documented below in the visit note. 
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
## 2015-08-21 NOTE — Patient Instructions (Signed)
For your elevated bp, I want you to check bp daily at work. If bp is 140/90 or above go ahead and start hctz. Also get bp cuff otc. It will be beneficial in future as you monitor your bp. Let us know if you start med.  Your head ache is mild. Can use tylenol. If ha worsens then notify us. Mild ha may be bp elevation related. If ha with neuro type signs or symptoms then ED evaluation.  For your rt lower quadrant region pain this may be groin muscle type pain. I do want to get cbc today. Check your wbc level. If pain were to increase let me know may need to get imaging studies to check on appendix. In this area ovarian cyst also could be potential diagnosis.  Follow up in 10 days or as needed

## 2015-08-22 LAB — CBC WITH DIFFERENTIAL/PLATELET
BASOS PCT: 2.7 % (ref 0.0–3.0)
Basophils Absolute: 0.2 10*3/uL — ABNORMAL HIGH (ref 0.0–0.1)
EOS ABS: 0.1 10*3/uL (ref 0.0–0.7)
EOS PCT: 1 % (ref 0.0–5.0)
HEMATOCRIT: 37 % (ref 36.0–46.0)
HEMOGLOBIN: 11.5 g/dL — AB (ref 12.0–15.0)
LYMPHS PCT: 39.6 % (ref 12.0–46.0)
Lymphs Abs: 3.1 10*3/uL (ref 0.7–4.0)
MCHC: 31.2 g/dL (ref 30.0–36.0)
MCV: 70.7 fl — ABNORMAL LOW (ref 78.0–100.0)
MONO ABS: 0.4 10*3/uL (ref 0.1–1.0)
Monocytes Relative: 4.9 % (ref 3.0–12.0)
Neutro Abs: 4.1 10*3/uL (ref 1.4–7.7)
Neutrophils Relative %: 51.8 % (ref 43.0–77.0)
Platelets: 280 10*3/uL (ref 150.0–400.0)
RBC: 5.23 Mil/uL — AB (ref 3.87–5.11)
RDW: 15.4 % (ref 11.5–15.5)
WBC: 7.9 10*3/uL (ref 4.0–10.5)

## 2015-08-23 ENCOUNTER — Telehealth: Payer: Self-pay

## 2015-08-23 DIAGNOSIS — R1031 Right lower quadrant pain: Secondary | ICD-10-CM

## 2015-08-23 NOTE — Telephone Encounter (Signed)
Patient has been scheduled for 08/24/15 at 9am

## 2015-08-23 NOTE — Telephone Encounter (Signed)
Since pain is on moving and decreased with sitting could be groin muscle. But recent decrease appetite for past 2 days(usual for patient and she is concerned has appendicitis) . But no nausea, vomiting or fever. At pt request will try to get abd/pelvis  CT tomororow. But advised it pain worsens before tomorrow then ED evaluation.  Please schedule

## 2015-08-23 NOTE — Telephone Encounter (Signed)
Spoke with pt and voices understanding. Pt states that she is still experiencing some pain on the right side and it comes and goes throughout the day. Pt reports the pain is worse when moving and when she is sitting she gets some relief. Pt wants to know what type of imaging you are considering. Please advise.

## 2015-08-24 ENCOUNTER — Encounter (HOSPITAL_BASED_OUTPATIENT_CLINIC_OR_DEPARTMENT_OTHER): Payer: Self-pay

## 2015-08-24 ENCOUNTER — Ambulatory Visit (HOSPITAL_BASED_OUTPATIENT_CLINIC_OR_DEPARTMENT_OTHER)
Admission: RE | Admit: 2015-08-24 | Discharge: 2015-08-24 | Disposition: A | Discharge status: Home / Self Care | Payer: BC Managed Care – PPO | Source: Ambulatory Visit | Attending: Medical | Admitting: Medical

## 2015-08-24 ENCOUNTER — Other Ambulatory Visit: Payer: Self-pay | Admitting: Medical

## 2015-08-24 ENCOUNTER — Telehealth: Payer: Self-pay | Admitting: Medical

## 2015-08-24 DIAGNOSIS — R1031 Right lower quadrant pain: Secondary | ICD-10-CM | POA: Diagnosis present

## 2015-08-24 MED ORDER — DICLOFENAC SODIUM 75 MG PO TBEC: 75.0000 mg | DELAYED_RELEASE_TABLET | Freq: Two times a day (BID) | ORAL | Status: DC

## 2015-08-24 MED ORDER — IOPAMIDOL (ISOVUE-300) INJECTION 61%: 100.0000 mL | Freq: Once | INTRAVENOUS | Status: DC | PRN

## 2015-08-24 NOTE — Telephone Encounter (Signed)
rx diclofenac sent in.

## 2015-10-14 ENCOUNTER — Other Ambulatory Visit: Payer: Self-pay

## 2015-10-14 MED ORDER — HYDROCHLOROTHIAZIDE 12.5 MG PO TABS: 12.5000 mg | ORAL_TABLET | Freq: Every day | ORAL | 1 refills | Status: DC

## 2016-01-21 ENCOUNTER — Ambulatory Visit (INDEPENDENT_AMBULATORY_CARE_PROVIDER_SITE_OTHER): Payer: BC Managed Care – PPO | Admitting: Family Medicine

## 2016-01-21 ENCOUNTER — Encounter: Payer: Self-pay | Admitting: Family Medicine

## 2016-01-21 VITALS — BP 138/88 | HR 73 | Temp 98.5°F | Resp 16 | Ht 67.0 in | Wt 233.2 lb

## 2016-01-21 DIAGNOSIS — I10 Essential (primary) hypertension: Secondary | ICD-10-CM | POA: Diagnosis not present

## 2016-01-21 MED ORDER — HYDROCHLOROTHIAZIDE 25 MG PO TABS: 25.0000 mg | ORAL_TABLET | Freq: Every day | ORAL | 11 refills | Status: DC

## 2016-01-21 NOTE — Progress Notes (Signed)
Pre visit review using our clinic review tool, if applicable. No additional management support is needed unless otherwise documented below in the visit note. 

## 2016-01-21 NOTE — Patient Instructions (Signed)
Hypertension Hypertension, commonly called high blood pressure, is when the force of blood pumping through your arteries is too strong. Your arteries are the blood vessels that carry blood from your heart throughout your body. A blood pressure reading consists of a higher number over a lower number, such as 110/72. The higher number (systolic) is the pressure inside your arteries when your heart pumps. The lower number (diastolic) is the pressure inside your arteries when your heart relaxes. Ideally you want your blood pressure below 120/80. Hypertension forces your heart to work harder to pump blood. Your arteries may become narrow or stiff. Having untreated or uncontrolled hypertension can cause heart attack, stroke, kidney disease, and other problems. RISK FACTORS Some risk factors for high blood pressure are controllable. Others are not.  Risk factors you cannot control include:   Race. You may be at higher risk if you are African American.  Age. Risk increases with age.  Gender. Men are at higher risk than women before age 45 years. After age 65, women are at higher risk than men. Risk factors you can control include:  Not getting enough exercise or physical activity.  Being overweight.  Getting too much fat, sugar, calories, or salt in your diet.  Drinking too much alcohol. SIGNS AND SYMPTOMS Hypertension does not usually cause signs or symptoms. Extremely high blood pressure (hypertensive crisis) may cause headache, anxiety, shortness of breath, and nosebleed. DIAGNOSIS To check if you have hypertension, your health care provider will measure your blood pressure while you are seated, with your arm held at the level of your heart. It should be measured at least twice using the same arm. Certain conditions can cause a difference in blood pressure between your right and left arms. A blood pressure reading that is higher than normal on one occasion does not mean that you need treatment. If  it is not clear whether you have high blood pressure, you may be asked to return on a different day to have your blood pressure checked again. Or, you may be asked to monitor your blood pressure at home for 1 or more weeks. TREATMENT Treating high blood pressure includes making lifestyle changes and possibly taking medicine. Living a healthy lifestyle can help lower high blood pressure. You may need to change some of your habits. Lifestyle changes may include:  Following the DASH diet. This diet is high in fruits, vegetables, and whole grains. It is low in salt, red meat, and added sugars.  Keep your sodium intake below 2,300 mg per day.  Getting at least 30-45 minutes of aerobic exercise at least 4 times per week.  Losing weight if necessary.  Not smoking.  Limiting alcoholic beverages.  Learning ways to reduce stress. Your health care provider may prescribe medicine if lifestyle changes are not enough to get your blood pressure under control, and if one of the following is true:  You are 18-59 years of age and your systolic blood pressure is above 140.  You are 60 years of age or older, and your systolic blood pressure is above 150.  Your diastolic blood pressure is above 90.  You have diabetes, and your systolic blood pressure is over 140 or your diastolic blood pressure is over 90.  You have kidney disease and your blood pressure is above 140/90.  You have heart disease and your blood pressure is above 140/90. Your personal target blood pressure may vary depending on your medical conditions, your age, and other factors. HOME CARE INSTRUCTIONS    Have your blood pressure rechecked as directed by your health care provider.   Take medicines only as directed by your health care provider. Follow the directions carefully. Blood pressure medicines must be taken as prescribed. The medicine does not work as well when you skip doses. Skipping doses also puts you at risk for  problems.  Do not smoke.   Monitor your blood pressure at home as directed by your health care provider. SEEK MEDICAL CARE IF:   You think you are having a reaction to medicines taken.  You have recurrent headaches or feel dizzy.  You have swelling in your ankles.  You have trouble with your vision. SEEK IMMEDIATE MEDICAL CARE IF:  You develop a severe headache or confusion.  You have unusual weakness, numbness, or feel faint.  You have severe chest or abdominal pain.  You vomit repeatedly.  You have trouble breathing. MAKE SURE YOU:   Understand these instructions.  Will watch your condition.  Will get help right away if you are not doing well or get worse.   This information is not intended to replace advice given to you by your health care provider. Make sure you discuss any questions you have with your health care provider.   Document Released: 03/09/2005 Document Revised: 07/24/2014 Document Reviewed: 12/30/2012 Elsevier Interactive Patient Education 2016 Elsevier Inc[employer] DASH Eating Plan DASH stands for "Dietary Approaches to Stop Hypertension." The DASH eating plan is a healthy eating plan that has been shown to reduce high blood pressure (hypertension). Additional health benefits may include reducing the risk of type 2 diabetes mellitus, heart disease, and stroke. The DASH eating plan may also help with weight loss. WHAT DO I NEED TO KNOW ABOUT THE DASH EATING PLAN? For the DASH eating plan, you will follow these general guidelines:  Choose foods with a percent daily value for sodium of less than 5% (as listed on the food label).  Use salt-free seasonings or herbs instead of table salt or sea salt.  Check with your health care provider or pharmacist before using salt substitutes.  Eat lower-sodium products, often labeled as "lower sodium" or "no salt added."  Eat fresh foods.  Eat more vegetables, fruits, and low-fat dairy products.  Choose whole grains.  Look for the word "whole" as the first word in the ingredient list.  Choose fish and skinless chicken or turkey more often than red meat. Limit fish, poultry, and meat to 6 oz (170 g) each day.  Limit sweets, desserts, sugars, and sugary drinks.  Choose heart-healthy fats.  Limit cheese to 1 oz (28 g) per day.  Eat more home-cooked food and less restaurant, buffet, and fast food.  Limit fried foods.  Cook foods using methods other than frying.  Limit canned vegetables. If you do use them, rinse them well to decrease the sodium.  When eating at a restaurant, ask that your food be prepared with less salt, or no salt if possible. WHAT FOODS CAN I EAT? Seek help from a dietitian for individual calorie needs. Grains Whole grain or whole wheat bread. Brown rice. Whole grain or whole wheat pasta. Quinoa, bulgur, and whole grain cereals. Low-sodium cereals. Corn or whole wheat flour tortillas. Whole grain cornbread. Whole grain crackers. Low-sodium crackers. Vegetables Fresh or frozen vegetables (raw, steamed, roasted, or grilled). Low-sodium or reduced-sodium tomato and vegetable juices. Low-sodium or reduced-sodium tomato sauce and paste. Low-sodium or reduced-sodium canned vegetables.  Fruits All fresh, canned (in natural juice), or frozen fruits. Meat and Other   Protein Products Ground beef (85% or leaner), grass-fed beef, or beef trimmed of fat. Skinless chicken or turkey. Ground chicken or turkey. Pork trimmed of fat. All fish and seafood. Eggs. Dried beans, peas, or lentils. Unsalted nuts and seeds. Unsalted canned beans. Dairy Low-fat dairy products, such as skim or 1% milk, 2% or reduced-fat cheeses, low-fat ricotta or cottage cheese, or plain low-fat yogurt. Low-sodium or reduced-sodium cheeses. Fats and Oils Tub margarines without trans fats. Light or reduced-fat mayonnaise and salad dressings (reduced sodium). Avocado. Safflower, olive, or canola oils. Natural peanut or almond  butter. Other Unsalted popcorn and pretzels. The items listed above may not be a complete list of recommended foods or beverages. Contact your dietitian for more options. WHAT FOODS ARE NOT RECOMMENDED? Grains White bread. White pasta. White rice. Refined cornbread. Bagels and croissants. Crackers that contain trans fat. Vegetables Creamed or fried vegetables. Vegetables in a cheese sauce. Regular canned vegetables. Regular canned tomato sauce and paste. Regular tomato and vegetable juices. Fruits Dried fruits. Canned fruit in light or heavy syrup. Fruit juice. Meat and Other Protein Products Fatty cuts of meat. Ribs, chicken wings, bacon, sausage, bologna, salami, chitterlings, fatback, hot dogs, bratwurst, and packaged luncheon meats. Salted nuts and seeds. Canned beans with salt. Dairy Whole or 2% milk, cream, half-and-half, and cream cheese. Whole-fat or sweetened yogurt. Full-fat cheeses or blue cheese. Nondairy creamers and whipped toppings. Processed cheese, cheese spreads, or cheese curds. Condiments Onion and garlic salt, seasoned salt, table salt, and sea salt. Canned and packaged gravies. Worcestershire sauce. Tartar sauce. Barbecue sauce. Teriyaki sauce. Soy sauce, including reduced sodium. Steak sauce. Fish sauce. Oyster sauce. Cocktail sauce. Horseradish. Ketchup and mustard. Meat flavorings and tenderizers. Bouillon cubes. Hot sauce. Tabasco sauce. Marinades. Taco seasonings. Relishes. Fats and Oils Butter, stick margarine, lard, shortening, ghee, and bacon fat. Coconut, palm kernel, or palm oils. Regular salad dressings. Other Pickles and olives. Salted popcorn and pretzels. The items listed above may not be a complete list of foods and beverages to avoid. Contact your dietitian for more information. WHERE CAN I FIND MORE INFORMATION? National Heart, Lung, and Blood Institute: www.nhlbi.nih.gov/health/health-topics/topics/dash/   This information is not intended to replace  advice given to you by your health care provider. Make sure you discuss any questions you have with your health care provider.   Document Released: 02/26/2011 Document Revised: 03/30/2014 Document Reviewed: 01/11/2013 Elsevier Interactive Patient Education 2016 Elsevier Inc.  
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
## 2016-01-22 DIAGNOSIS — I10 Essential (primary) hypertension: Secondary | ICD-10-CM

## 2016-01-22 HISTORY — DX: Essential (primary) hypertension: I10

## 2016-01-22 LAB — BASIC METABOLIC PANEL
BUN: 13 mg/dL (ref 6–23)
CHLORIDE: 101 meq/L (ref 96–112)
CO2: 28 meq/L (ref 19–32)
Calcium: 9.4 mg/dL (ref 8.4–10.5)
Creatinine, Ser: 0.78 mg/dL (ref 0.40–1.20)
GFR: 103.88 mL/min (ref >60.00)
GLUCOSE: 89 mg/dL (ref 70–99)
POTASSIUM: 4.2 meq/L (ref 3.5–5.1)
SODIUM: 136 meq/L (ref 135–145)

## 2016-01-22 NOTE — Assessment & Plan Note (Signed)
Dash diet Exercise Restart hctz F/u 2 weeks

## 2016-02-06 ENCOUNTER — Ambulatory Visit (INDEPENDENT_AMBULATORY_CARE_PROVIDER_SITE_OTHER): Payer: BC Managed Care – PPO | Admitting: Medical

## 2016-02-06 VITALS — BP 126/80 | HR 66 | Temp 97.7°F | Wt 234.6 lb

## 2016-02-06 DIAGNOSIS — R0981 Nasal congestion: Secondary | ICD-10-CM | POA: Diagnosis not present

## 2016-02-06 DIAGNOSIS — R51 Headache: Secondary | ICD-10-CM | POA: Diagnosis not present

## 2016-02-06 DIAGNOSIS — I10 Essential (primary) hypertension: Secondary | ICD-10-CM

## 2016-02-06 DIAGNOSIS — R519 Headache, unspecified: Secondary | ICD-10-CM

## 2016-02-06 MED ORDER — BUTALBITAL-APAP-CAFF-COD 50-325-40-30 MG PO CAPS: 1.0000 | ORAL_CAPSULE | ORAL | 0 refills | Status: DC | PRN

## 2016-02-06 MED ORDER — SUMATRIPTAN SUCCINATE 50 MG PO TABS: ORAL_TABLET | ORAL | 0 refills | Status: DC

## 2016-02-06 MED ORDER — FLUTICASONE PROPIONATE 50 MCG/ACT NA SUSP: 2.0000 | Freq: Every day | NASAL | 1 refills | Status: DC

## 2016-02-06 NOTE — Progress Notes (Signed)
Pre visit review using our clinic review tool, if applicable. No additional management support is needed unless otherwise documented below in the visit note. 

## 2016-02-06 NOTE — Patient Instructions (Addendum)
For htn continue to check your bp. Was 130/80 when you checked since I last saw you but mild increase today. Continue hctz. Bp elevation today may be due to migraine ha pain.  Check bp daily over next week and document readings.  For you ha(likely migraine) will rx fiorcet. Try this tonight. You declined toradol IM today. If ha same level tomorrow we could give.   Will rx imitrex for future migraine like ha. To use early on at onset.  If your ha worsens or neurologic signs/symptoms as discussed  then ED evaluation tonight.  If you are ready to be referred back to HA clinic just let us know. May need to do so depending on how you are doing.  Please let me know by tomorrow 9-10 am if ha resolved.  Follow up in 2-3 weeks or as needed  Rx of flonase for nasal congestion. If worse symptoms indicating infection will  prescribe antibiotic.
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
## 2016-08-13 ENCOUNTER — Ambulatory Visit (HOSPITAL_COMMUNITY)
Admission: EM | Admit: 2016-08-13 | Discharge: 2016-08-13 | Disposition: A | Discharge status: Home / Self Care | Payer: Managed Care, Other (non HMO) | Attending: Family Medicine | Admitting: Family Medicine

## 2016-08-13 ENCOUNTER — Encounter (HOSPITAL_COMMUNITY): Payer: Self-pay | Admitting: Emergency Medicine

## 2016-08-13 DIAGNOSIS — M545 Low back pain, unspecified: Secondary | ICD-10-CM

## 2016-08-13 DIAGNOSIS — T148XXA Other injury of unspecified body region, initial encounter: Secondary | ICD-10-CM

## 2016-08-13 LAB — POCT URINALYSIS DIP (DEVICE)
Bilirubin Urine: NEGATIVE
GLUCOSE, UA: NEGATIVE mg/dL
Hgb urine dipstick: NEGATIVE
Ketones, ur: NEGATIVE mg/dL
LEUKOCYTES UA: NEGATIVE
Nitrite: NEGATIVE
PROTEIN: NEGATIVE mg/dL
Specific Gravity, Urine: 1.025 (ref 1.005–1.030)
UROBILINOGEN UA: 0.2 mg/dL (ref 0.0–1.0)
pH: 5.5 (ref 5.0–8.0)

## 2016-08-13 MED ORDER — KETOROLAC TROMETHAMINE 30 MG/ML IJ SOLN
30.0000 mg | Freq: Once | INTRAMUSCULAR | Status: AC
  Administered 2016-08-13: 30 mg via INTRAMUSCULAR

## 2016-08-13 MED ORDER — HYDROCODONE-ACETAMINOPHEN 5-325 MG PO TABS: 1.0000 | ORAL_TABLET | ORAL | 0 refills | Status: DC | PRN

## 2016-08-13 MED ORDER — METHOCARBAMOL 500 MG PO TABS: 500.0000 mg | ORAL_TABLET | Freq: Two times a day (BID) | ORAL | 0 refills | Status: DC

## 2016-08-13 MED ORDER — NAPROXEN 375 MG PO TABS
375.0000 mg | ORAL_TABLET | Freq: Two times a day (BID) | ORAL | 0 refills | Status: DC
Start: 2016-08-13 — End: 2018-08-16
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
## 2016-08-13 NOTE — ED Triage Notes (Signed)
Pt has been suffering from lower left back pain that radiates through her hip and groin and down her left leg.

## 2016-08-13 NOTE — Discharge Instructions (Signed)
Start to apply heat to the areas of soreness and pain. Slowly began small movements to perform stretches of the involved muscles. You may need to use a walking assistance apparatus such as a cane or other device to help with movement and ambulation as needed. Take the medication as directed. May cause some drowsiness. Follow-up with primary care doctor as needed.

## 2016-08-18 ENCOUNTER — Encounter: Payer: Self-pay | Admitting: Medical

## 2016-08-18 ENCOUNTER — Ambulatory Visit (INDEPENDENT_AMBULATORY_CARE_PROVIDER_SITE_OTHER): Payer: Managed Care, Other (non HMO) | Admitting: Medical

## 2016-08-18 ENCOUNTER — Ambulatory Visit (HOSPITAL_BASED_OUTPATIENT_CLINIC_OR_DEPARTMENT_OTHER)
Admission: RE | Admit: 2016-08-18 | Discharge: 2016-08-18 | Disposition: A | Discharge status: Home / Self Care | Payer: Managed Care, Other (non HMO) | Source: Ambulatory Visit | Attending: Medical | Admitting: Medical

## 2016-08-18 VITALS — BP 146/74 | HR 83 | Temp 98.3°F | Resp 16 | Ht 67.0 in | Wt 242.6 lb

## 2016-08-18 DIAGNOSIS — M47896 Other spondylosis, lumbar region: Secondary | ICD-10-CM | POA: Insufficient documentation

## 2016-08-18 DIAGNOSIS — M5442 Lumbago with sciatica, left side: Secondary | ICD-10-CM

## 2016-08-18 MED ORDER — PREDNISONE 10 MG PO TABS: ORAL_TABLET | ORAL | 0 refills | Status: DC

## 2016-08-18 NOTE — Patient Instructions (Addendum)
For your back pain with persisting features recommend using muscle relaxant at night and continue the hydrocodone as needed.  Will add tapered 6 day prednisone.(No nsaids)  Will get xray of lumbar spine today.  Red flag signs and symptoms reviewed that would necessitate ED eval.  Back exercises as tolerated when pain level decreases. Consider PT  Or sports med if pain persist. Consider mri if pain continues high level with radicular features.  Follow up in 10-14 days or as needed   Back Exercises If you have pain in your back, do these exercises 2-3 times each day or as told by your doctor. When the pain goes away, do the exercises once each day, but repeat the steps more times for each exercise (do more repetitions). If you do not have pain in your back, do these exercises once each day or as told by your doctor. Exercises Single Knee to Chest   Do these steps 3-5 times in a row for each leg: 1. Lie on your back on a firm bed or the floor with your legs stretched out. 2. Bring one knee to your chest. 3. Hold your knee to your chest by grabbing your knee or thigh. 4. Pull on your knee until you feel a gentle stretch in your lower back. 5. Keep doing the stretch for 10-30 seconds. 6. Slowly let go of your leg and straighten it. Pelvic Tilt   Do these steps 5-10 times in a row: 1. Lie on your back on a firm bed or the floor with your legs stretched out. 2. Bend your knees so they point up to the ceiling. Your feet should be flat on the floor. 3. Tighten your lower belly (abdomen) muscles to press your lower back against the floor. This will make your tailbone point up to the ceiling instead of pointing down to your feet or the floor. 4. Stay in this position for 5-10 seconds while you gently tighten your muscles and breathe evenly. Cat-Cow   Do these steps until your lower back bends more easily: 1. Get on your hands and knees on a firm surface. Keep your hands under your shoulders,  and keep your knees under your hips. You may put padding under your knees. 2. Let your head hang down, and make your tailbone point down to the floor so your lower back is round like the back of a cat. 3. Stay in this position for 5 seconds. 4. Slowly lift your head and make your tailbone point up to the ceiling so your back hangs low (sags) like the back of a cow. 5. Stay in this position for 5 seconds. Press-Ups   Do these steps 5-10 times in a row: 1. Lie on your belly (face-down) on the floor. 2. Place your hands near your head, about shoulder-width apart. 3. While you keep your back relaxed and keep your hips on the floor, slowly straighten your arms to raise the top half of your body and lift your shoulders. Do not use your back muscles. To make yourself more comfortable, you may change where you place your hands. 4. Stay in this position for 5 seconds. 5. Slowly return to lying flat on the floor. Bridges   Do these steps 10 times in a row: 1. Lie on your back on a firm surface. 2. Bend your knees so they point up to the ceiling. Your feet should be flat on the floor. 3. Tighten your butt muscles and lift your butt off of  the floor until your waist is almost as high as your knees. If you do not feel the muscles working in your butt and the back of your thighs, slide your feet 1-2 inches farther away from your butt. 4. Stay in this position for 3-5 seconds. 5. Slowly lower your butt to the floor, and let your butt muscles relax. If this exercise is too easy, try doing it with your arms crossed over your chest. Belly Crunches   Do these steps 5-10 times in a row: 1. Lie on your back on a firm bed or the floor with your legs stretched out. 2. Bend your knees so they point up to the ceiling. Your feet should be flat on the floor. 3. Cross your arms over your chest. 4. Tip your chin a little bit toward your chest but do not bend your neck. 5. Tighten your belly muscles and slowly raise  your chest just enough to lift your shoulder blades a tiny bit off of the floor. 6. Slowly lower your chest and your head to the floor. Back Lifts  Do these steps 5-10 times in a row: 1. Lie on your belly (face-down) with your arms at your sides, and rest your forehead on the floor. 2. Tighten the muscles in your legs and your butt. 3. Slowly lift your chest off of the floor while you keep your hips on the floor. Keep the back of your head in line with the curve in your back. Look at the floor while you do this. 4. Stay in this position for 3-5 seconds. 5. Slowly lower your chest and your face to the floor. Contact a doctor if:  Your back pain gets a lot worse when you do an exercise.  Your back pain does not lessen 2 hours after you exercise. If you have any of these problems, stop doing the exercises. Do not do them again unless your doctor says it is okay. Get help right away if:  You have sudden, very bad back pain. If this happens, stop doing the exercises. Do not do them again unless your doctor says it is okay. This information is not intended to replace advice given to you by your health care provider. Make sure you discuss any questions you have with your health care provider. Document Released: 04/11/2010 Document Revised: 08/15/2015 Document Reviewed: 05/03/2014 Elsevier Interactive Patient Education  2017 [employer].
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
## 2017-03-12 DIAGNOSIS — G43909 Migraine, unspecified, not intractable, without status migrainosus: Secondary | ICD-10-CM

## 2017-03-12 HISTORY — DX: Migraine, unspecified, not intractable, without status migrainosus: G43.909

## 2018-01-10 ENCOUNTER — Emergency Department (HOSPITAL_COMMUNITY)
Admission: EM | Admit: 2018-01-10 | Discharge: 2018-01-11 | Disposition: A | Discharge status: Home / Self Care | Payer: 59 | Attending: Emergency Medicine | Admitting: Emergency Medicine

## 2018-01-10 ENCOUNTER — Emergency Department (HOSPITAL_COMMUNITY): Payer: 59

## 2018-01-10 ENCOUNTER — Other Ambulatory Visit: Payer: Self-pay

## 2018-01-10 ENCOUNTER — Ambulatory Visit (HOSPITAL_COMMUNITY)
Admission: EM | Admit: 2018-01-10 | Discharge: 2018-01-10 | Disposition: A | Discharge status: Home / Self Care | Payer: Managed Care, Other (non HMO)

## 2018-01-10 ENCOUNTER — Encounter (HOSPITAL_COMMUNITY): Payer: Self-pay | Admitting: Emergency Medicine

## 2018-01-10 DIAGNOSIS — R079 Chest pain, unspecified: Secondary | ICD-10-CM | POA: Diagnosis present

## 2018-01-10 DIAGNOSIS — Z79899 Other long term (current) drug therapy: Secondary | ICD-10-CM | POA: Insufficient documentation

## 2018-01-10 DIAGNOSIS — R0789 Other chest pain: Secondary | ICD-10-CM | POA: Insufficient documentation

## 2018-01-10 DIAGNOSIS — H81399 Other peripheral vertigo, unspecified ear: Secondary | ICD-10-CM

## 2018-01-10 DIAGNOSIS — R51 Headache: Secondary | ICD-10-CM | POA: Insufficient documentation

## 2018-01-10 DIAGNOSIS — R42 Dizziness and giddiness: Secondary | ICD-10-CM | POA: Insufficient documentation

## 2018-01-10 LAB — CBC
HEMATOCRIT: 39.3 % (ref 36.0–46.0)
HEMOGLOBIN: 11.4 g/dL — AB (ref 12.0–15.0)
MCH: 21.1 pg — ABNORMAL LOW (ref 26.0–34.0)
MCHC: 29 g/dL — AB (ref 30.0–36.0)
MCV: 72.6 fL — AB (ref 80.0–100.0)
Platelets: 312 10*3/uL (ref 150–400)
RBC: 5.41 MIL/uL — ABNORMAL HIGH (ref 3.87–5.11)
RDW: 16 % — ABNORMAL HIGH (ref 11.5–15.5)
WBC: 9.2 10*3/uL (ref 4.0–10.5)
nRBC: 0 % (ref 0.0–0.2)

## 2018-01-10 LAB — I-STAT BETA HCG BLOOD, ED (MC, WL, AP ONLY)

## 2018-01-10 LAB — I-STAT TROPONIN, ED: Troponin i, poc: 0 ng/mL (ref 0.00–0.08)

## 2018-01-10 LAB — BASIC METABOLIC PANEL
Anion gap: 8 (ref 5–15)
BUN: 10 mg/dL (ref 6–20)
CHLORIDE: 101 mmol/L (ref 98–111)
CO2: 25 mmol/L (ref 22–32)
Calcium: 9.3 mg/dL (ref 8.9–10.3)
Creatinine, Ser: 0.77 mg/dL (ref 0.44–1.00)
GFR calc Af Amer: >60 mL/min (ref >60)
GFR calc non Af Amer: >60 mL/min (ref >60)
Glucose, Bld: 99 mg/dL (ref 70–99)
POTASSIUM: 3.9 mmol/L (ref 3.5–5.1)
SODIUM: 134 mmol/L — AB (ref 135–145)

## 2018-01-10 NOTE — ED Provider Notes (Signed)
Patient placed in Quick Look pathway, seen and evaluated   Chief Complaint: chest pain  HPI: Sarah Mcdowell is a 42 y.o. female who presents to the ED with chest pain. The pain that started yesterday.  She denies any shortness of breath at this time.  She states that she is having some numbness on the right side as well with the chest pain.  She states that the had dizziness and a headache yesterday.  No nausea or vomiting per patient.    ROS: Neuro: dizziness, headache  CV: chest pain Physical Exam:  There were no vitals taken for this visit.   Gen: No distress  Neuro: Awake and Alert  Skin: Warm and dry  Heart: regular rate and rhythm  Lungs: without wheezing or rales.      Initiation of care has begun. The patient has been counseled on the process, plan, and necessity for staying for the completion/evaluation, and the remainder of the medical screening examination    Ashley Murrain, NP 01/10/18 Casimer Lanius    Blanchie Dessert, MD 01/11/18 1429

## 2018-01-10 NOTE — ED Triage Notes (Signed)
Patient with chest pain that started yesterday.  She denies any shortness of breath at this time.  She states that she is having some numbness on the right side as well with the chest pain.  She states that the had dizziness and a headache yesterday.  No nausea or vomiting per patient.

## 2018-01-10 NOTE — ED Notes (Signed)
Taken to ER by Baldo Ash, RN

## 2018-01-11 ENCOUNTER — Emergency Department (HOSPITAL_COMMUNITY): Payer: 59

## 2018-01-11 LAB — I-STAT TROPONIN, ED: Troponin i, poc: 0 ng/mL (ref 0.00–0.08)

## 2018-01-11 MED ORDER — ONDANSETRON 4 MG PO TBDP: 4.0000 mg | ORAL_TABLET | Freq: Four times a day (QID) | ORAL | 0 refills | Status: DC | PRN

## 2018-01-11 MED ORDER — MECLIZINE HCL 25 MG PO TABS
50.0000 mg | ORAL_TABLET | Freq: Once | ORAL | Status: AC
  Administered 2018-01-11: 50 mg via ORAL
  Filled 2018-01-11: qty 2

## 2018-01-11 MED ORDER — ACETAMINOPHEN 500 MG PO TABS
1000.0000 mg | ORAL_TABLET | Freq: Once | ORAL | Status: AC
  Administered 2018-01-11: 1000 mg via ORAL
  Filled 2018-01-11: qty 2

## 2018-01-11 MED ORDER — SODIUM CHLORIDE 0.9 % IV BOLUS (SEPSIS)
1000.0000 mL | Freq: Once | INTRAVENOUS | Status: AC
  Administered 2018-01-11: 1000 mL via INTRAVENOUS

## 2018-01-11 MED ORDER — MECLIZINE HCL 25 MG PO TABS: 50.0000 mg | ORAL_TABLET | Freq: Three times a day (TID) | ORAL | 0 refills | Status: DC | PRN

## 2018-01-11 NOTE — ED Provider Notes (Signed)
TIME SEEN: 4:50 AM  CHIEF COMPLAINT: Chest pain, dizziness, right arm numbness  HPI: Patient is a 42 year old female with history of gestational hypertension, migraines who presents to the emergency department with complaints of bilateral sharp and burning intermittent chest pain that started 2 days ago.  No associated shortness of breath, nausea, vomiting, diaphoresis or lightheadedness.  Also experienced some numbness in the upper part of the right upper extremity that started 4 PM yesterday.  Also states that 2 days ago she felt like she was having dizziness where she describes it as the room was spinning.  Also reports 3 days of headache similar to her previous migraines.  Feels like both of her lower extremities feel "heavy".  No history of diabetes, hyperlipidemia, stroke, MI, tobacco use.  No family history of CAD, stroke.  States vertigo is worse with movement.  Better when she stays still.  No hearing loss, tinnitus, ear pain.  ROS: See HPI Constitutional: no fever  Eyes: no drainage  ENT: no runny nose   Cardiovascular:   chest pain  Resp: no SOB  GI: no vomiting GU: no dysuria Integumentary: no rash  Allergy: no hives  Musculoskeletal: no leg swelling  Neurological: no slurred speech ROS otherwise negative  PAST MEDICAL HISTORY/PAST SURGICAL HISTORY:  Past Medical History:  Diagnosis Date  . Allergy    seasonal  . AMA (advanced maternal age) multigravida 6+   . Anemia   . Fibroids   . Headache(784.0)    migraines  . History of chlamydia 1992  . History of cystitis   . Hypertension   . Obese     MEDICATIONS:  Prior to Admission medications   Medication Sig Start Date End Date Taking? Authorizing Provider  butalbital-acetaminophen-caffeine (FIORICET/CODEINE) 50-325-40-30 MG capsule Take 1 capsule by mouth every 4 (four) hours as needed for headache. Patient not taking: Reported on 08/18/2016 02/06/16   Saguier, Percell Miller, PA-C  fluticasone [facility]) 50 MCG/ACT nasal  spray Place 2 sprays into both nostrils daily. Patient not taking: Reported on 08/18/2016 02/06/16   Saguier, Percell Miller, PA-C  hydrochlorothiazide (HYDRODIURIL) 25 MG tablet Take 1 tablet (25 mg total) by mouth daily. 1 po qd prn Patient not taking: Reported on 08/18/2016 01/21/16 01/20/17  Ann Held, DO  HYDROcodone-acetaminophen (NORCO/VICODIN) 5-325 MG tablet Take 1 tablet by mouth every 4 (four) hours as needed. 08/13/16   Janne Napoleon, NP  methocarbamol (ROBAXIN) 500 MG tablet Take 1 tablet (500 mg total) by mouth 2 (two) times daily. For muscle relaxant. May cause drowsiness. 08/13/16   Janne Napoleon, NP  Multiple Vitamin (MULTIVITAMIN) capsule Take 1 capsule by mouth daily.    [provider]  naproxen (NAPROSYN) 375 MG tablet Take 1 tablet (375 mg total) by mouth 2 (two) times daily. 08/13/16   Janne Napoleon, NP  predniSONE (DELTASONE) 10 MG tablet 6 TAB PO DAY 1 5 TAB PO DAY 2 4 TAB PO DAY 3 3 TAB PO DAY 4 2 TAB PO DAY 5 1 TAB PO DAY 6 08/18/16   Saguier, Percell Miller, PA-C  SUMAtriptan (IMITREX) 50 MG tablet 1 tab po at onset of migraine ha. Repeat in 2 hours if needed. Max number of tabs in 24 hours is 2. Patient not taking: Reported on 08/18/2016 02/06/16   Saguier, Percell Miller, PA-C    ALLERGIES:  No Known Allergies  SOCIAL HISTORY:  Social History   Tobacco Use  . Smoking status: Never Smoker  . Smokeless tobacco: Never Used  Substance Use Topics  .  Alcohol use: Yes    Alcohol/week: 0.0 standard drinks    Comment: rare    FAMILY HISTORY: Family History  Problem Relation Age of Onset  . Peripheral vascular disease Mother   . Diabetes Father   . Seizures Father   . Cancer Father        prostate  . Hypertension Sister   . Diabetes Paternal Grandmother   . Anesthesia problems Neg Hx   . Hypotension Neg Hx   . Malignant hyperthermia Neg Hx   . Pseudochol deficiency Neg Hx     EXAM: BP 120/68   Pulse 80   Temp 98.9 F (37.2 C) (Oral)   Resp 18   Ht 5\' 7"   (1.702 m)   Wt 108.9 kg   SpO2 99%   BMI 37.59 kg/m  CONSTITUTIONAL: Alert and oriented and responds appropriately to questions. Well-appearing; well-nourished HEAD: Normocephalic EYES: Conjunctivae clear, pupils appear equal, EOMI ENT: normal nose; moist mucous membranes; TMs are clear bilaterally without erythema, purulence, bulging, perforation, effusion.  No cerumen impaction or sign of foreign body in the external auditory canal. No inflammation, erythema or drainage from the external auditory canal. No signs of mastoiditis. No pain with manipulation of the pinna bilaterally. NECK: Supple, no meningismus, no nuchal rigidity, no LAD  CARD: RRR; S1 and S2 appreciated; no murmurs, no clicks, no rubs, no gallops RESP: Normal chest excursion without splinting or tachypnea; breath sounds clear and equal bilaterally; no wheezes, no rhonchi, no rales, no hypoxia or respiratory distress, speaking full sentences ABD/GI: Normal bowel sounds; non-distended; soft, non-tender, no rebound, no guarding, no peritoneal signs, no hepatosplenomegaly BACK:  The back appears normal and is non-tender to palpation, there is no CVA tenderness EXT: Normal ROM in all joints; non-tender to palpation; no edema; normal capillary refill; no cyanosis, no calf tenderness or swelling    SKIN: Normal color for age and race; warm; no rash NEURO: Moves all extremities equally, normal sensation diffusely, cranial nerves II through XII intact, normal speech, strength 5/5 in all 4 extremities, patient does have some horizontal fatigable nystagmus when looking to the left which reproduces her symptoms PSYCH: The patient's mood and manner are appropriate. Grooming and personal hygiene are appropriate.  MEDICAL DECISION MAKING: Patient here with atypical chest pain.  Doubt ACS, PE or dissection.  Chest x-ray clear.  Troponin negative.  EKG shows no ischemic change.  Will obtain second troponin while she is waiting in the emergency  department.  She is requesting Tylenol for her headache.  Will give IV fluids and meclizine for her vertigo.  This seems to be peripheral in nature.  As for her vertigo and right arm numbness, low suspicion for stroke but will obtain head CT.  Numbness involve just the right arm from elbow up and is now currently gone.  She has no focal neurologic deficits present.  ED PROGRESS: Patient's second troponin negative.  Head CT is negative as well.  Patient reports headache, vertigo, chest pain all much better after Tylenol, IV fluids, meclizine.  Suspect peripheral vertigo.  Chest pain seems very atypical.  Doubt stroke, TIA.  I feel she is safe to be discharged home.   At this time, I do not feel there is any life-threatening condition present. I have reviewed and discussed all results (EKG, imaging, lab, urine as appropriate) and exam findings with patient/family. I have reviewed nursing notes and appropriate previous records.  I feel the patient is safe to be discharged home without further  emergent workup and can continue workup as an outpatient as needed. Discussed usual and customary return precautions. Patient/family verbalize understanding and are comfortable with this plan.  Outpatient follow-up has been provided if needed. All questions have been answered.      EKG Interpretation  Date/Time:  Monday January 10 2018 19:50:42 EDT Ventricular Rate:  80 PR Interval:  180 QRS Duration: 84 QT Interval:  378 QTC Calculation: 435 R Axis:   52 Text Interpretation:  Normal sinus rhythm Cannot rule out Anterior infarct , age undetermined Abnormal ECG No old tracing to compare Confirmed by Brenner Visconti, Cyril Mourning [phone]) on 01/11/2018 5:36:25 AM         Sherlyn Ebbert, Delice Bison, DO 01/11/18 [id number]

## 2018-01-11 NOTE — ED Notes (Signed)
Pt reports a headache for the past 3 days. Pt reports nothing has touched this headache and she has tried everything possible.

## 2018-01-11 NOTE — Discharge Instructions (Signed)
You may alternate Tylenol 1000 mg every 6 hours as needed for pain and Ibuprofen 800 mg every 8 hours as needed for pain.  Please take Ibuprofen with food. ° °

## 2018-05-25 ENCOUNTER — Other Ambulatory Visit: Payer: Self-pay | Admitting: Obstetrics and Gynecology

## 2018-05-25 DIAGNOSIS — D259 Leiomyoma of uterus, unspecified: Secondary | ICD-10-CM

## 2018-05-25 HISTORY — DX: Leiomyoma of uterus, unspecified: D25.9

## 2018-06-30 ENCOUNTER — Telehealth: Payer: Self-pay

## 2018-06-30 NOTE — Telephone Encounter (Signed)
Multiple ED visits- last ov 2018- virtual visit?

## 2018-07-06 NOTE — Telephone Encounter (Signed)
Left message on machine to call back to schedule virtual visit.

## 2018-07-26 ENCOUNTER — Encounter: Payer: Self-pay | Admitting: Family Medicine

## 2018-07-26 ENCOUNTER — Other Ambulatory Visit: Payer: Self-pay

## 2018-07-26 ENCOUNTER — Ambulatory Visit (INDEPENDENT_AMBULATORY_CARE_PROVIDER_SITE_OTHER): Payer: PRIVATE HEALTH INSURANCE | Admitting: Family Medicine

## 2018-07-26 VITALS — Temp 98.4°F | Ht 67.0 in | Wt 252.0 lb

## 2018-07-26 DIAGNOSIS — R079 Chest pain, unspecified: Secondary | ICD-10-CM

## 2018-07-26 DIAGNOSIS — K219 Gastro-esophageal reflux disease without esophagitis: Secondary | ICD-10-CM

## 2018-07-26 MED ORDER — FAMOTIDINE 20 MG PO TABS: 20.0000 mg | ORAL_TABLET | Freq: Two times a day (BID) | ORAL | 2 refills | Status: DC

## 2018-07-26 NOTE — Progress Notes (Signed)
Virtual Visit via Video Note  I connected with Sarah Mcdowell on 07/26/18 at  2:30 PM EDT by a video enabled telemedicine application and verified that I am speaking with the correct person using two identifiers.  Location: Patient: home Provider: office   I discussed the limitations of evaluation and management by telemedicine and the availability of in person appointments. The patient expressed understanding and agreed to proceed.  History of Present Illness: Pt here to reestablish care.  She had been seen in er for chest pain and all test normal.  It was advised that she see her pcp and have testing done as out patient    Observations/Objective: 252 lbs  5 ft 61/2 in Pt in nad She will call with bp when her daughter brings cuff over  Assessment and Plan: 1. Chest pain, unspecified type Check echo Pt to come in to office in next few months for labs and further workup If cp returns --- go to ER --- pt er visit , labs, imaging reviewed  - ECHOCARDIOGRAM COMPLETE; Future  2. Gastroesophageal reflux disease, esophagitis presence not specified D/w pt diet for gerd Start pepcid Consider GI if no better  - famotidine (PEPCID) 20 MG tablet; Take 1 tablet (20 mg total) by mouth 2 (two) times daily.  Dispense: 60 tablet; Refill: 2   Follow Up Instructions:    I discussed the assessment and treatment plan with the patient. The patient was provided an opportunity to ask questions and all were answered. The patient agreed with the plan and demonstrated an understanding of the instructions.   The patient was advised to call back or seek an in-person evaluation if the symptoms worsen or if the condition fails to improve as anticipated.  I provided 25 minutes of non-face-to-face time during this encounter.   Ann Held, DO

## 2018-08-16 ENCOUNTER — Encounter: Payer: Self-pay | Admitting: Family Medicine

## 2018-08-16 ENCOUNTER — Ambulatory Visit (INDEPENDENT_AMBULATORY_CARE_PROVIDER_SITE_OTHER): Payer: PRIVATE HEALTH INSURANCE | Admitting: Family Medicine

## 2018-08-16 DIAGNOSIS — L0291 Cutaneous abscess, unspecified: Secondary | ICD-10-CM | POA: Diagnosis not present

## 2018-08-16 MED ORDER — DOXYCYCLINE HYCLATE 100 MG PO TABS: 100.0000 mg | ORAL_TABLET | Freq: Two times a day (BID) | ORAL | 0 refills | Status: DC

## 2018-08-16 NOTE — Progress Notes (Signed)
Virtual Visit via Video Note  I connected with Sarah Mcdowell on 08/16/18 at  4:15 PM EDT by a video enabled telemedicine application and verified that I am speaking with the correct person using two identifiers.  Location: Patient: in car  Provider: office    I discussed the limitations of evaluation and management by telemedicine and the availability of in person appointments. The patient expressed understanding and agreed to proceed.  History of Present Illness: Pt is home sitting in her car in the driveway c/o abscess on R upper thigh --- it has drained and is tender to touch.  Pt husband has a hx of recurrent mrsa.   Observations/Objective: No vitals obtained  Abscess upper R thigh About 1 in diameter With s   Assessment and Plan: 1. Abscess abx per orderes Keep lesion clean rto prn  - doxycycline (VIBRA-TABS) 100 MG tablet; Take 1 tablet (100 mg total) by mouth 2 (two) times daily.  Dispense: 20 tablet; Refill: 0   Follow Up Instructions:    I discussed the assessment and treatment plan with the patient. The patient was provided an opportunity to ask questions and all were answered. The patient agreed with the plan and demonstrated an understanding of the instructions.   The patient was advised to call back or seek an in-person evaluation if the symptoms worsen or if the condition fails to improve as anticipated.  I provided 15 minutes of non-face-to-face time during this encounter.   Ann Held, DO

## 2018-08-29 ENCOUNTER — Telehealth (HOSPITAL_COMMUNITY): Payer: Self-pay | Admitting: *Deleted

## 2018-08-29 NOTE — Telephone Encounter (Signed)
COVID-19 Pre-Screening Questions:  . Do you currently have a fever? No (yes = cancel and refer to pcp for e-visit) . Have you recently travelled on a cruise, internationally, or to [place], [place], [place], [place], [place], or [place], [place] ([place]) ? No (yes = cancel, stay home, monitor symptoms, and contact pcp or initiate e-visit if symptoms develop) . Have you been in contact with someone that is currently pending confirmation of Covid19 testing or has been confirmed to have the Covid19 virus?  No (yes = cancel, stay home, away from tested individual, monitor symptoms, and contact pcp or initiate e-visit if symptoms develop) . Are you currently experiencing fatigue or cough? No (yes = pt should be prepared to have a mask placed at the time of their visit).   . Reiterated no additional visitors. . Arrive no earlier than 15 minutes before appointment time. . Please bring own mask.  Sarah Mcdowell 

## 2018-08-30 ENCOUNTER — Other Ambulatory Visit: Payer: Self-pay

## 2018-08-30 ENCOUNTER — Ambulatory Visit (HOSPITAL_COMMUNITY): Payer: PRIVATE HEALTH INSURANCE | Attending: Cardiovascular Disease

## 2018-08-30 DIAGNOSIS — R079 Chest pain, unspecified: Secondary | ICD-10-CM | POA: Diagnosis present

## 2018-09-01 ENCOUNTER — Other Ambulatory Visit: Payer: Self-pay | Admitting: Family Medicine

## 2018-09-01 DIAGNOSIS — R079 Chest pain, unspecified: Secondary | ICD-10-CM

## 2018-09-01 DIAGNOSIS — I5189 Other ill-defined heart diseases: Secondary | ICD-10-CM

## 2018-09-12 DIAGNOSIS — R079 Chest pain, unspecified: Secondary | ICD-10-CM

## 2018-09-12 DIAGNOSIS — K219 Gastro-esophageal reflux disease without esophagitis: Secondary | ICD-10-CM

## 2018-09-12 HISTORY — DX: Gastro-esophageal reflux disease without esophagitis: K21.9

## 2018-09-12 HISTORY — DX: Chest pain, unspecified: R07.9
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
## 2018-09-13 ENCOUNTER — Encounter: Payer: Self-pay | Admitting: Cardiology

## 2018-09-13 ENCOUNTER — Other Ambulatory Visit: Payer: Self-pay

## 2018-09-13 ENCOUNTER — Ambulatory Visit (INDEPENDENT_AMBULATORY_CARE_PROVIDER_SITE_OTHER): Payer: PRIVATE HEALTH INSURANCE | Admitting: Cardiology

## 2018-09-13 VITALS — BP 136/92 | HR 71 | Temp 98.1°F | Ht 67.0 in | Wt 253.8 lb

## 2018-09-13 DIAGNOSIS — R079 Chest pain, unspecified: Secondary | ICD-10-CM | POA: Diagnosis not present

## 2018-09-13 DIAGNOSIS — I10 Essential (primary) hypertension: Secondary | ICD-10-CM | POA: Diagnosis not present

## 2018-09-13 DIAGNOSIS — I5189 Other ill-defined heart diseases: Secondary | ICD-10-CM | POA: Insufficient documentation

## 2018-09-13 HISTORY — DX: Other ill-defined heart diseases: I51.89

## 2018-09-13 MED ORDER — AMLODIPINE BESYLATE 5 MG PO TABS: 5.0000 mg | ORAL_TABLET | Freq: Every day | ORAL | 1 refills | Status: DC

## 2018-09-13 MED ORDER — CELECOXIB 100 MG PO CAPS: 100.0000 mg | ORAL_CAPSULE | Freq: Two times a day (BID) | ORAL | 0 refills | Status: DC

## 2018-09-13 MED ORDER — METOPROLOL TARTRATE 50 MG PO TABS: ORAL_TABLET | ORAL | 0 refills | Status: DC

## 2018-09-13 NOTE — Patient Instructions (Addendum)
Medication Instructions:  Your physician has recommended you make the following change in your medication:   START: Celebrex 100 mg : TAKE 1 tab twice daily START: Amlodipine 5 mg : Take 1 tab daily  If you need a refill on your cardiac medications before your next appointment, please call your pharmacy.   Lab work: Your physician recommends that you return for lab work in:   3-7 days prior to CT  If you have labs (blood work) drawn today and your tests are completely normal, you will receive your results only by:  [place] (if you have MyChart) OR  A paper copy in the mail If you have any lab test that is abnormal or we need to change your treatment, we will call you to review the results.  Testing/Procedures: Your physician has requested that you have cardiac CT. Cardiac computed tomography (CT) is a painless test that uses an x-ray machine to take clear, detailed pictures of your heart. For further information please visit [email or web address]. Please follow instruction sheet as given.   Please arrive at the [facility] main entrance of [facility] at xx:xx AM (30-45 minutes prior to test start time)  [facility] [place], [place] [address] [phone]  Proceed to the [facility] Radiology Department (First Floor).  Please follow these instructions carefully (unless otherwise directed):   On the Night Before the Test:  Be sure to Drink plenty of water.  Do not consume any caffeinated/decaffeinated beverages or chocolate 12 hours prior to your test.  Do not take any antihistamines 12 hours prior to your test.  On the Day of the Test:  Drink plenty of water. Do not drink any water within one hour of the test.  Do not eat any food 4 hours prior to the test.  You may take your regular medications prior to the test.   Take metoprolol (Lopressor) two hours prior to test                   -If HR is less than 55 BPM- No  Beta Blocker                -IF HR is greater than 55 BPM and patient is less than or equal to 35 yrs old Lopressor 100mg  x1.                      After the Test:  Drink plenty of water.  After receiving IV contrast, you may experience a mild flushed feeling. This is normal.  On occasion, you may experience a mild rash up to 24 hours after the test. This is not dangerous. If this occurs, you can take Benadryl 25 mg and increase your fluid intake.  If you experience trouble breathing, this can be serious. If it is severe call 911 IMMEDIATELY. If it is mild, please call our office.  If you take any of these medications: Glipizide/Metformin, Avandament, Glucavance, please do not take 48 hours after completing test.   Follow-Up: At [facility], you and your health needs are our priority.  As part of our continuing mission to provide you with exceptional heart care, we have created designated Provider Care Teams.  These Care Teams include your primary Cardiologist (physician) and Advanced Practice Providers (APPs -  Physician Assistants and Nurse Practitioners) who all work together to provide you with the care you need, when you need it. You will need a  follow up appointment in 6 weeks. Any Other Special Instructions Will Be Listed Below (If Applicable).   Amlodipine tablets What is this medicine? AMLODIPINE (am LOE di peen) is a calcium-channel blocker. It affects the amount of calcium found in your heart and muscle cells. This relaxes your blood vessels, which can reduce the amount of work the heart has to do. This medicine is used to lower high blood pressure. It is also used to prevent chest pain. This medicine may be used for other purposes; ask your health care provider or pharmacist if you have questions. COMMON BRAND NAME(S): Norvasc What should I tell my health care provider before I take this medicine? They need to know if you have any of these conditions: -heart  disease -liver disease -an unusual or allergic reaction to amlodipine, other medicines, foods, dyes, or preservatives -pregnant or trying to get pregnant -breast-feeding How should I use this medicine? Take this medicine by mouth with a glass of water. Follow the directions on the prescription label. You can take it with or without food. If it upsets your stomach, take it with food. Take your medicine at regular intervals. Do not take it more often than directed. Do not stop taking except on your doctor's advice. Talk to your pediatrician regarding the use of this medicine in children. While this drug may be prescribed for children as young as 6 years for selected conditions, precautions do apply. Patients over 76 years of age may have a stronger reaction and need a smaller dose. Overdosage: If you think you have taken too much of this medicine contact a poison control center or emergency room at once. NOTE: This medicine is only for you. Do not share this medicine with others. What if I miss a dose? If you miss a dose, take it as soon as you can. If it is almost time for your next dose, take only that dose. Do not take double or extra doses. What may interact with this medicine? Do not take this medicine with any of the following medications: -tranylcypromine This medicine may also interact with the following medications: -clarithromycin -cyclosporine -diltiazem -itraconazole -simvastatin -tacrolimus This list may not describe all possible interactions. Give your health care provider a list of all the medicines, herbs, non-prescription drugs, or dietary supplements you use. Also tell them if you smoke, drink alcohol, or use illegal drugs. Some items may interact with your medicine. What should I watch for while using this medicine? Visit your healthcare professional for regular checks on your progress. Check your blood pressure as directed. Ask your healthcare professional what your blood  pressure should be and when you should contact him or her. Do not treat yourself for coughs, colds, or pain while you are using this medicine without asking your healthcare professional for advice. Some medicines may increase your blood pressure. You may get dizzy. Do not drive, use machinery, or do anything that needs mental alertness until you know how this medicine affects you. Do not stand or sit up quickly, especially if you are an older patient. This reduces the risk of dizzy or fainting spells. Avoid alcoholic drinks; they can make you dizzier. What side effects may I notice from receiving this medicine? Side effects that you should report to your doctor or health care professional as soon as possible: -allergic reactions like skin rash, itching or hives; swelling of the face, lips, or tongue -fast, irregular heartbeat -signs and symptoms of low blood pressure like dizziness; feeling faint or  lightheaded, falls; unusually weak or tired -swelling of ankles, feet, hands Side effects that usually do not require medical attention (report these to your doctor or health care professional if they continue or are bothersome): -dry mouth -facial flushing -headache -stomach pain -tiredness This list may not describe all possible side effects. Call your doctor for medical advice about side effects. You may report side effects to FDA at 1-800-FDA-1088. Where should I keep my medicine? Keep out of the reach of children. Store at room temperature between 59 and 86 degrees F (15 and 30 degrees C). Throw away any unused medicine after the expiration date. NOTE: This sheet is a summary. It may not cover all possible information. If you have questions about this medicine, talk to your doctor, pharmacist, or health care provider.  2019 Elsevier/Gold Standard (2017-10-01 15:07:10)   Celecoxib capsules What is this medicine? CELECOXIB (sell a KOX ib) is a non-steroidal anti-inflammatory drug (NSAID).  This medicine is used to treat arthritis and ankylosing spondylitis. It may be also used for pain or painful monthly periods. This medicine may be used for other purposes; ask your health care provider or pharmacist if you have questions. COMMON BRAND NAME(S): Celebrex What should I tell my health care provider before I take this medicine? They need to know if you have any of these conditions: -asthma -coronary artery bypass graft (CABG) surgery within the past 2 weeks -drink more than 3 alcohol-containing drinks a day -heart disease or circulation problems like heart failure or leg edema (fluid retention) -high blood pressure -kidney disease -liver disease -stomach bleeding or ulcers -an unusual or allergic reaction to celecoxib, sulfa drugs, aspirin, other NSAIDs, other medicines, foods, dyes, or preservatives -pregnant or trying to get pregnant -breast-feeding How should I use this medicine? Take this medicine by mouth with a full glass of water. Follow the directions on the prescription label. Take it with food if it upsets your stomach or if you take 400 mg at one time. Try to not lie down for at least 10 minutes after you take the medicine. Take the medicine at the same time each day. Do not take more medicine than you are told to take. Long-term, continuous use may increase the risk of heart attack or stroke. A special MedGuide will be given to you by the pharmacist with each prescription and refill. Be sure to read this information carefully each time. Talk to your pediatrician regarding the use of this medicine in children. Special care may be needed. Overdosage: If you think you have taken too much of this medicine contact a poison control center or emergency room at once. NOTE: This medicine is only for you. Do not share this medicine with others. What if I miss a dose? If you miss a dose, take it as soon as you can. If it is almost time for your next dose, take only that dose. Do  not take double or extra doses. What may interact with this medicine? Do not take this medicine with any of the following medications: -cidofovir -methotrexate -other NSAIDs, medicines for pain and inflammation, like ibuprofen or naproxen -pemetrexed This medicine may also interact with the following medications: -alcohol -aspirin and aspirin-like drugs -diuretics -fluconazole -lithium -medicines for high blood pressure -steroid medicines like prednisone or cortisone -warfarin This list may not describe all possible interactions. Give your health care provider a list of all the medicines, herbs, non-prescription drugs, or dietary supplements you use. Also tell them if you smoke, drink alcohol,  or use illegal drugs. Some items may interact with your medicine. What should I watch for while using this medicine? Tell your doctor or health care professional if your pain does not get better. Talk to your doctor before taking another medicine for pain. Do not treat yourself. This medicine does not prevent heart attack or stroke. In fact, this medicine may increase the chance of a heart attack or stroke. The chance may increase with longer use of this medicine and in people who have heart disease. If you take aspirin to prevent heart attack or stroke, talk with your doctor or health care professional. Do not take medicines such as ibuprofen and naproxen with this medicine. Side effects such as stomach upset, nausea, or ulcers may be more likely to occur. Many medicines available without a prescription should not be taken with this medicine. This medicine can cause ulcers and bleeding in the stomach and intestines at any time during treatment. Ulcers and bleeding can happen without warning symptoms and can cause death. What side effects may I notice from receiving this medicine? Side effects that you should report to your doctor or health care professional as soon as possible: -allergic reactions like  skin rash, itching or hives, swelling of the face, lips, or tongue -black or bloody stools, blood in the urine or vomit -blurred vision -breathing problems -chest pain -nausea, vomiting -problems with balance, talking, walking -redness, blistering, peeling or loosening of the skin, including inside the mouth -unexplained weight gain or swelling -unusually weak or tired -yellowing of eyes, skin Side effects that usually do not require medical attention (report to your doctor or health care professional if they continue or are bothersome): -constipation or diarrhea -dizziness -gas or heartburn -upset stomach This list may not describe all possible side effects. Call your doctor for medical advice about side effects. You may report side effects to FDA at 1-800-FDA-1088. Where should I keep my medicine? Keep out of the reach of children. Store at room temperature between 15 and 30 degrees C (59 and 86 degrees F). Keep container tightly closed. Throw away any unused medicine after the expiration date. NOTE: This sheet is a summary. It may not cover all possible information. If you have questions about this medicine, talk to your doctor, pharmacist, or health care provider.  2019 Elsevier/Gold Standard (2009-05-08 10:54:17)                       [employer]  Tips to measure your blood pressure correctly  To determine whether you have hypertension, a medical professional will take a blood pressure reading. How you prepare for the test, the position of your arm, and other factors can change a blood pressure reading by 10% or more. That could be enough to hide high blood pressure, start you on a drug you don't really need, or lead your doctor to incorrectly adjust your medications. National and international guidelines offer specific instructions for measuring blood pressure. If a doctor, nurse, or medical assistant isn't doing it right, don't hesitate to ask him or her to get  with the guidelines. Here's what you can do to ensure a correct reading:  Don't drink a caffeinated beverage or smoke during the 30 minutes before the test.  Sit quietly for five minutes before the test begins.  During the measurement, sit in a chair with your feet on the floor and your arm supported so your elbow is at about heart level.  The inflatable part of  the cuff should completely cover at least 80% of your upper arm, and the cuff should be placed on bare skin, not over a shirt.  Don't talk during the measurement.  Have your blood pressure measured twice, with a brief break in between. If the readings are different by 5 points or more, have it done a third time. There are times to break these rules. If you sometimes feel lightheaded when getting out of bed in the morning or when you stand after sitting, you should have your blood pressure checked while seated and then while standing to see if it falls from one position to the next. Because blood pressure varies throughout the day, your doctor will rarely diagnose hypertension on the basis of a single reading. Instead, he or she will want to confirm the measurements on at least two occasions, usually within a few weeks of one another. The exception to this rule is if you have a blood pressure reading of 180/110 mm Hg or higher. A result this high usually calls for prompt treatment. It's also a good idea to have your blood pressure measured in both arms at least once, since the reading in one arm (usually the right) may be higher than that in the left. A 2014 study in The American Journal of Medicine of nearly 3,400 people found average arm- to-arm differences in systolic blood pressure of about 5 points. The higher number should be used to make treatment decisions. In 2017, new guidelines from the [place], the [employer] of Cardiology, and nine other health organizations lowered the diagnosis of high blood pressure to  130/80 mm Hg or higher for all adults. The guidelines also redefined the various blood pressure categories to now include normal, elevated, Stage 1 hypertension, Stage 2 hypertension, and hypertensive crisis (see "Blood pressure categories"). Blood pressure categories  Blood pressure category SYSTOLIC (upper number)  DIASTOLIC (lower number)  Normal Less than 120 mm Hg and Less than 80 mm Hg  Elevated 120-129 mm Hg and Less than 80 mm Hg  High blood pressure: Stage 1 hypertension 130-139 mm Hg or 80-89 mm Hg  High blood pressure: Stage 2 hypertension 140 mm Hg or higher or 90 mm Hg or higher  Hypertensive crisis (consult your doctor immediately) Higher than 180 mm Hg and/or Higher than 120 mm Hg  Source: American Heart Association and American Stroke Association. For more on getting your blood pressure under control, buy Controlling Your Blood Pressure, a Special Health Report from [facility]. Costochondritis Costochondritis is swelling and irritation (inflammation) of the tissue (cartilage) that connects your ribs to your breastbone (sternum). This causes pain in the front of your chest. Usually, the pain:  Starts gradually.  Is in more than one rib. This condition usually goes away on its own over time. Follow these instructions at home:  Do not do anything that makes your pain worse.  If directed, put ice on the painful area: ? Put ice in a plastic bag. ? Place a towel between your skin and the bag. ? Leave the ice on for 20 minutes, 2-3 times a day.  If directed, put heat on the affected area as often as told by your doctor. Use the heat source that your doctor tells you to use, such as a moist heat pack or a heating pad. ? Place a towel between your skin and the heat source. ? Leave the heat on for 20-30 minutes. ? Take off the heat if your  skin turns bright red. This is very important if you cannot feel pain, heat, or cold. You may have a greater risk of getting  burned.  Take over-the-counter and prescription medicines only as told by your doctor.  Return to your normal activities as told by your doctor. Ask your doctor what activities are safe for you.  Keep all follow-up visits as told by your doctor. This is important. Contact a doctor if:  You have chills or a fever.  Your pain does not go away or it gets worse.  You have a cough that does not go away. Get help right away if:  You are short of breath. This information is not intended to replace advice given to you by your health care provider. Make sure you discuss any questions you have with your health care provider. Document Released: 08/26/2007 Document Revised: 09/27/2015 Document Reviewed: 07/03/2015  DASH diet: Healthy eating to lower your blood pressure The DASH diet emphasizes portion size, eating a variety of foods and getting the right amount of nutrients. Discover how DASH can improve your health and lower your blood pressure. By [facility] Staff  DASH stands for Dietary Approaches to Stop Hypertension. The DASH diet is a lifelong approach to healthy eating that's designed to help treat or prevent high blood pressure (hypertension). The DASH diet encourages you to reduce the sodium in your diet and eat a variety of foods rich in nutrients that help lower blood pressure, such as potassium, calcium and magnesium. By following the DASH diet, you may be able to reduce your blood pressure by a few points in just two weeks. Over time, your systolic blood pressure could drop by eight to 14 points, which can make a significant difference in your health risks. Because the DASH diet is a healthy way of eating, it offers health benefits besides just lowering blood pressure. The DASH diet is also in line with dietary recommendations to prevent osteoporosis, cancer, heart disease, stroke and diabetes. DASH diet: Sodium levels The DASH diet emphasizes vegetables, fruits and low-fat dairy foods  -- and moderate amounts of whole grains, fish, poultry and nuts. In addition to the standard DASH diet, there is also a lower sodium version of the diet. You can choose the version of the diet that meets your health needs: Standard DASH diet. You can consume up to 2,300 milligrams (mg) of sodium a day.  Lower sodium DASH diet. You can consume up to 1,500 mg of sodium a day. Both versions of the DASH diet aim to reduce the amount of sodium in your diet compared with what you might get in a typical American diet, which can amount to a whopping 3,400 mg of sodium a day or more. The standard DASH diet meets the recommendation from the Dietary Guidelines for Americans to keep daily sodium intake to less than 2,300 mg a day. The American Heart Association recommends 1,500 mg a day of sodium as an upper limit for all adults. If you aren't sure what sodium level is right for you, talk to your doctor. DASH diet: What to eat Both versions of the DASH diet include lots of whole grains, fruits, vegetables and low-fat dairy products. The DASH diet also includes some fish, poultry and legumes, and encourages a small amount of nuts and seeds a few times a week.  You can eat red meat, sweets and fats in small amounts. The DASH diet is low in saturated fat, cholesterol and total fat. Here's a look  at the recommended servings from each food group for the 2,000-calorie-a-day DASH diet. Grains: 6 to 8 servings a day Grains include bread, cereal, rice and pasta. Examples of one serving of grains include 1 slice whole-wheat bread, 1 ounce dry cereal, or 1/2 cup cooked cereal, rice or pasta. Focus on whole grains because they have more fiber and nutrients than do refined grains. For instance, use brown rice instead of white rice, whole-wheat pasta instead of regular pasta and whole-grain bread instead of white bread. Look for products labeled "100 percent whole grain" or "100 percent whole wheat."  Grains are naturally low  in fat. Keep them this way by avoiding butter, cream and cheese sauces. Vegetables: 4 to 5 servings a day Tomatoes, carrots, broccoli, sweet potatoes, greens and other vegetables are full of fiber, vitamins, and such minerals as potassium and magnesium. Examples of one serving include 1 cup raw leafy green vegetables or 1/2 cup cut-up raw or cooked vegetables. Don't think of vegetables only as side dishes -- a hearty blend of vegetables served over brown rice or whole-wheat noodles can serve as the main dish for a meal.  Fresh and frozen vegetables are both good choices. When buying frozen and canned vegetables, choose those labeled as low sodium or without added salt.  To increase the number of servings you fit in daily, be creative. In a stir-fry, for instance, cut the amount of meat in half and double up on the vegetables. Fruits: 4 to 5 servings a day Many fruits need little preparation to become a healthy part of a meal or snack. Like vegetables, they're packed with fiber, potassium and magnesium and are typically low in fat -- coconuts are an exception. Examples of one serving include one medium fruit, 1/2 cup fresh, frozen or canned fruit, or 4 ounces of juice. Have a piece of fruit with meals and one as a snack, then round out your day with a dessert of fresh fruits topped with a dollop of low-fat yogurt.  Leave on edible peels whenever possible. The peels of apples, pears and most fruits with pits add interesting texture to recipes and contain healthy nutrients and fiber.  Remember that citrus fruits and juices, such as grapefruit, can interact with certain medications, so check with your doctor or pharmacist to see if they're OK for you.  If you choose canned fruit or juice, make sure no sugar is added. Dairy: 2 to 3 servings a day Milk, yogurt, cheese and other dairy products are major sources of calcium, vitamin D and protein. But the key is to make sure that you choose dairy products that  are low fat or fat-free because otherwise they can be a major source of fat -- and most of it is saturated. Examples of one serving include 1 cup skim or 1 percent milk, 1 cup low fat yogurt, or 1 1/2 ounces part-skim cheese. Low-fat or fat-free frozen yogurt can help you boost the amount of dairy products you eat while offering a sweet treat. Add fruit for a healthy twist.  If you have trouble digesting dairy products, choose lactose-free products or consider taking an over-the-counter product that contains the enzyme lactase, which can reduce or prevent the symptoms of lactose intolerance.  Go easy on regular and even fat-free cheeses because they are typically high in sodium. Lean meat, poultry and fish: 6 servings or fewer a day Meat can be a rich source of protein, B vitamins, iron and zinc. Choose lean varieties and  aim for no more than 6 ounces a day. Cutting back on your meat portion will allow room for more vegetables. Trim away skin and fat from poultry and meat and then bake, broil, grill or roast instead of frying in fat.  Eat heart-healthy fish, such as salmon, herring and tuna. These types of fish are high in omega-3 fatty acids, which can help lower your total cholesterol. Nuts, seeds and legumes: 4 to 5 servings a week Almonds, sunflower seeds, kidney beans, peas, lentils and other foods in this family are good sources of magnesium, potassium and protein. They're also full of fiber and phytochemicals, which are plant compounds that may protect against some cancers and cardiovascular disease. Serving sizes are small and are intended to be consumed only a few times a week because these foods are high in calories. Examples of one serving include 1/3 cup nuts, 2 tablespoons seeds, or 1/2 cup cooked beans or peas.  Nuts sometimes get a bad rap because of their fat content, but they contain healthy types of fat -- monounsaturated fat and omega-3 fatty acids. They're high in calories, however, so  eat them in moderation. Try adding them to stir-fries, salads or cereals.  Soybean-based products, such as tofu and tempeh, can be a good alternative to meat because they contain all of the amino acids your body needs to make a complete protein, just like meat. Fats and oils: 2 to 3 servings a day Fat helps your body absorb essential vitamins and helps your body's immune system. But too much fat increases your risk of heart disease, diabetes and obesity. The DASH diet strives for a healthy balance by limiting total fat to less than 30 percent of daily calories from fat, with a focus on the healthier monounsaturated fats. Examples of one serving include 1 teaspoon soft margarine, 1 tablespoon mayonnaise or 2 tablespoons salad dressing. Saturated fat and trans fat are the main dietary culprits in increasing your risk of coronary artery disease. DASH helps keep your daily saturated fat to less than 6 percent of your total calories by limiting use of meat, butter, cheese, whole milk, cream and eggs in your diet, along with foods made from lard, solid shortenings, and palm and coconut oils.  Avoid trans fat, commonly found in such processed foods as crackers, baked goods and fried items.  Read food labels on margarine and salad dressing so that you can choose those that are lowest in saturated fat and free of trans fat. Sweets: 5 servings or fewer a week You don't have to banish sweets entirely while following the DASH diet -- just go easy on them. Examples of one serving include 1 tablespoon sugar, jelly or jam, 1/2 cup sorbet, or 1 cup lemonade. When you eat sweets, choose those that are fat-free or low-fat, such as sorbets, fruit ices, jelly beans, hard candy, graham crackers or low-fat cookies.  Artificial sweeteners such as aspartame (NutraSweet, Equal) and sucralose (Splenda) may help satisfy your sweet tooth while sparing the sugar. But remember that you still must use them sensibly. It's OK to swap a  diet cola for a regular cola, but not in place of a more nutritious beverage such as low-fat milk or even plain water.  Cut back on added sugar, which has no nutritional value but can pack on calories. DASH diet: Alcohol and caffeine Drinking too much alcohol can increase blood pressure. The Dietary Guidelines for Americans recommends that men limit alcohol to no more than two drinks a  day and women to one or less. The DASH diet doesn't address caffeine consumption. The influence of caffeine on blood pressure remains unclear. But caffeine can cause your blood pressure to rise at least temporarily. If you already have high blood pressure or if you think caffeine is affecting your blood pressure, talk to your doctor about your caffeine consumption. DASH diet and weight loss While the DASH diet is not a weight-loss program, you may indeed lose unwanted pounds because it can help guide you toward healthier food choices. The DASH diet generally includes about 2,000 calories a day. If you're trying to lose weight, you may need to eat fewer calories. You may also need to adjust your serving goals based on your individual circumstances -- something your health care team can help you decide. Tips to cut back on sodium The foods at the core of the DASH diet are naturally low in sodium. So just by following the DASH diet, you're likely to reduce your sodium intake. You also reduce sodium further by: Using sodium-free spices or flavorings with your food instead of salt  Not adding salt when cooking rice, pasta or hot cereal  Rinsing canned foods to remove some of the sodium  Buying foods labeled "no salt added," "sodium-free," "low sodium" or "very low sodium" One teaspoon of table salt has 2,325 mg of sodium. When you read food labels, you may be surprised at just how much sodium some processed foods contain. Even low-fat soups, canned vegetables, ready-to-eat cereals and sliced [place] from the local deli --  foods you may have considered healthy -- often have lots of sodium. You may notice a difference in taste when you choose low-sodium food and beverages. If things seem too bland, gradually introduce low-sodium foods and cut back on table salt until you reach your sodium goal. That'll give your palate time to adjust. Using salt-free seasoning blends or herbs and spices may also ease the transition. It can take several weeks for your taste buds to get used to less salty foods. Putting the pieces of the DASH diet together Try these strategies to get started on the DASH diet:  Change gradually. If you now eat only one or two servings of fruits or vegetables a day, try to add a serving at lunch and one at dinner. Rather than switching to all whole grains, start by making one or two of your grain servings whole grains. Increasing fruits, vegetables and whole grains gradually can also help prevent bloating or diarrhea that may occur if you aren't used to eating a diet with lots of fiber. You can also try over-the-counter products to help reduce gas from beans and vegetables.  Reward successes and forgive slip-ups. Reward yourself with a nonfood treat for your accomplishments -- rent a movie, purchase a book or get together with a friend. Everyone slips, especially when learning something new. Remember that changing your lifestyle is a long-term process. Find out what triggered your setback and then just pick up where you left off with the DASH diet.  Add physical activity. To boost your blood pressure lowering efforts even more, consider increasing your physical activity in addition to following the DASH diet. Combining both the DASH diet and physical activity makes it more likely that you'll reduce your blood pressure.  Get support if you need it. If you're having trouble sticking to your diet, talk to your doctor or dietitian about it. You might get some tips that will help you stick to the  DASH diet. Remember,  healthy eating isn't an all-or-nothing proposition. What's most important is that, on average, you eat healthier foods with plenty of variety -- both to keep your diet nutritious and to avoid boredom or extremes. And with the DASH diet, you can have both.  Elsevier Interactive Patient Education  [employer].    Cardiac CT Angiogram  A cardiac CT angiogram is a procedure to look at the heart and the area around the heart. It may be done to help find the cause of chest pains or other symptoms of heart disease. During this procedure, a large X-ray machine, called a CT scanner, takes detailed pictures of the heart and the surrounding area after a dye (contrast material) has been injected into blood vessels in the area. The procedure is also sometimes called a coronary CT angiogram, coronary artery scanning, or CTA. A cardiac CT angiogram allows the health care provider to see how well blood is flowing to and from the heart. The health care provider will be able to see if there are any problems, such as:  Blockage or narrowing of the coronary arteries in the heart.  Fluid around the heart.  Signs of weakness or disease in the muscles, valves, and tissues of the heart. Tell a health care provider about:  Any allergies you have. This is especially important if you have had a previous allergic reaction to contrast dye.  All medicines you are taking, including vitamins, herbs, eye drops, creams, and over-the-counter medicines.  Any blood disorders you have.  Any surgeries you have had.  Any medical conditions you have.  Whether you are pregnant or may be pregnant.  Any anxiety disorders, chronic pain, or other conditions you have that may increase your stress or prevent you from lying still. What are the risks? Generally, this is a safe procedure. However, problems may occur, including:  Bleeding.  Infection.  Allergic reactions to medicines or dyes.  Damage to other structures  or organs.  Kidney damage from the dye or contrast that is used.  Increased risk of cancer from radiation exposure. This risk is low. Talk with your health care provider about: ? The risks and benefits of testing. ? How you can receive the lowest dose of radiation. What happens before the procedure?  Wear comfortable clothing and remove any jewelry, glasses, dentures, and hearing aids.  Follow instructions from your health care provider about eating and drinking. This may include: ? For 12 hours before the test -- avoid caffeine. This includes tea, coffee, soda, energy drinks, and diet pills. Drink plenty of water or other fluids that do not have caffeine in them. Being well-hydrated can prevent complications. ? For 4-6 hours before the test -- stop eating and drinking. The contrast dye can cause nausea, but this is less likely if your stomach is empty.  Ask your health care provider about changing or stopping your regular medicines. This is especially important if you are taking diabetes medicines, blood thinners, or medicines to treat erectile dysfunction. What happens during the procedure?  Hair on your chest may need to be removed so that small sticky patches called electrodes can be placed on your chest. These will transmit information that helps to monitor your heart during the test.  An IV tube will be inserted into one of your veins.  You might be given a medicine to control your heart rate during the test. This will help to ensure that good images are obtained.  You will  be asked to lie on an exam table. This table will slide in and out of the CT machine during the procedure.  Contrast dye will be injected into the IV tube. You might feel warm, or you may get a metallic taste in your mouth.  You will be given a medicine (nitroglycerin) to relax (dilate) the arteries in your heart.  The table that you are lying on will move into the CT machine tunnel for the scan.  The person  running the machine will give you instructions while the scans are being done. You may be asked to: ? Keep your arms above your head. ? Hold your breath. ? Stay very still, even if the table is moving.  When the scanning is complete, you will be moved out of the machine.  The IV tube will be removed. The procedure may vary among health care providers and hospitals. What happens after the procedure?  You might feel warm, or you may get a metallic taste in your mouth from the contrast dye.  You may have a headache from the nitroglycerin.  After the procedure, drink water or other fluids to wash (flush) the contrast material out of your body.  Contact a health care provider if you have any symptoms of allergy to the contrast. These symptoms include: ? Shortness of breath. ? Rash or hives. ? A racing heartbeat.  Most people can return to their normal activities right after the procedure. Ask your health care provider what activities are safe for you.  It is up to you to get the results of your procedure. Ask your health care provider, or the department that is doing the procedure, when your results will be ready. Summary  A cardiac CT angiogram is a procedure to look at the heart and the area around the heart. It may be done to help find the cause of chest pains or other symptoms of heart disease.  During this procedure, a large X-ray machine, called a CT scanner, takes detailed pictures of the heart and the surrounding area after a dye (contrast material) has been injected into blood vessels in the area.  Ask your health care provider about changing or stopping your regular medicines before the procedure. This is especially important if you are taking diabetes medicines, blood thinners, or medicines to treat erectile dysfunction.  After the procedure, drink water or other fluids to wash (flush) the contrast material out of your body. This information is not intended to replace advice  given to you by your health care provider. Make sure you discuss any questions you have with your health care provider. Document Released: 02/20/2008 Document Revised: 01/27/2016 Document Reviewed: 01/27/2016 Elsevier Interactive Patient Education  2019 [employer].

## 2018-09-30 ENCOUNTER — Ambulatory Visit: Payer: PRIVATE HEALTH INSURANCE | Admitting: Family Medicine

## 2018-10-03 ENCOUNTER — Other Ambulatory Visit: Payer: Self-pay

## 2018-10-03 ENCOUNTER — Encounter: Payer: Self-pay | Admitting: Family Medicine

## 2018-10-03 ENCOUNTER — Ambulatory Visit (INDEPENDENT_AMBULATORY_CARE_PROVIDER_SITE_OTHER): Payer: PRIVATE HEALTH INSURANCE | Admitting: Family Medicine

## 2018-10-03 ENCOUNTER — Telehealth (HOSPITAL_COMMUNITY): Payer: Self-pay | Admitting: Emergency Medicine

## 2018-10-03 DIAGNOSIS — I10 Essential (primary) hypertension: Secondary | ICD-10-CM | POA: Diagnosis not present

## 2018-10-03 DIAGNOSIS — R079 Chest pain, unspecified: Secondary | ICD-10-CM

## 2018-10-03 LAB — COMPREHENSIVE METABOLIC PANEL
ALT: 21 U/L (ref 0–35)
AST: 18 U/L (ref 0–37)
Albumin: 4 g/dL (ref 3.5–5.2)
Alkaline Phosphatase: 68 U/L (ref 39–117)
BUN: 12 mg/dL (ref 6–23)
CO2: 26 mEq/L (ref 19–32)
Calcium: 8.7 mg/dL (ref 8.4–10.5)
Chloride: 102 mEq/L (ref 96–112)
Creatinine, Ser: 0.77 mg/dL (ref 0.40–1.20)
GFR: 97.97 mL/min (ref >60.00)
Glucose, Bld: 93 mg/dL (ref 70–99)
Potassium: 4.3 mEq/L (ref 3.5–5.1)
Sodium: 136 mEq/L (ref 135–145)
Total Bilirubin: 0.3 mg/dL (ref 0.2–1.2)
Total Protein: 7 g/dL (ref 6.0–8.3)

## 2018-10-03 LAB — CBC WITH DIFFERENTIAL/PLATELET
Basophils Absolute: 0 10*3/uL (ref 0.0–0.1)
Basophils Relative: 0.5 % (ref 0.0–3.0)
Eosinophils Absolute: 0.1 10*3/uL (ref 0.0–0.7)
Eosinophils Relative: 2.1 % (ref 0.0–5.0)
HCT: 35.6 % — ABNORMAL LOW (ref 36.0–46.0)
Hemoglobin: 11 g/dL — ABNORMAL LOW (ref 12.0–15.0)
Lymphocytes Relative: 41.9 % (ref 12.0–46.0)
Lymphs Abs: 2.8 10*3/uL (ref 0.7–4.0)
MCHC: 30.8 g/dL (ref 30.0–36.0)
MCV: 69.4 fl — ABNORMAL LOW (ref 78.0–100.0)
Monocytes Absolute: 0.5 10*3/uL (ref 0.1–1.0)
Monocytes Relative: 6.9 % (ref 3.0–12.0)
Neutro Abs: 3.2 10*3/uL (ref 1.4–7.7)
Neutrophils Relative %: 48.6 % (ref 43.0–77.0)
Platelets: 259 10*3/uL (ref 150.0–400.0)
RBC: 5.13 Mil/uL — ABNORMAL HIGH (ref 3.87–5.11)
RDW: 17 % — ABNORMAL HIGH (ref 11.5–15.5)
WBC: 6.6 10*3/uL (ref 4.0–10.5)

## 2018-10-03 LAB — LIPID PANEL
Cholesterol: 216 mg/dL — ABNORMAL HIGH (ref 0–200)
HDL: 38.9 mg/dL — ABNORMAL LOW (ref >39.00)
LDL Cholesterol: 146 mg/dL — ABNORMAL HIGH (ref 0–99)
NonHDL: 176.96
Total CHOL/HDL Ratio: 6
Triglycerides: 153 mg/dL — ABNORMAL HIGH (ref 0.0–149.0)
VLDL: 30.6 mg/dL (ref 0.0–40.0)

## 2018-10-03 LAB — VITAMIN D 25 HYDROXY (VIT D DEFICIENCY, FRACTURES): VITD: 33.57 ng/mL (ref 30.00–100.00)

## 2018-10-03 NOTE — Patient Instructions (Signed)

## 2018-10-03 NOTE — Assessment & Plan Note (Signed)
Improved with celebrex Ct tomorrow F/u cardiology

## 2018-10-03 NOTE — Assessment & Plan Note (Signed)
Well controlled, no changes to meds. Encouraged heart healthy diet such as the DASH diet and exercise as tolerated.  °

## 2018-10-03 NOTE — Assessment & Plan Note (Signed)
-   Refer to healthy weight and wellness 
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
## 2018-10-03 NOTE — Telephone Encounter (Signed)
Left message on voicemail with name and callback number Malik Ruffino RN Navigator Cardiac Imaging [facility] [phone] Office [phone] Cell  

## 2018-10-04 ENCOUNTER — Ambulatory Visit (HOSPITAL_COMMUNITY): Payer: PRIVATE HEALTH INSURANCE

## 2018-10-04 ENCOUNTER — Other Ambulatory Visit: Payer: Self-pay | Admitting: Family Medicine

## 2018-10-04 ENCOUNTER — Encounter: Payer: PRIVATE HEALTH INSURANCE | Admitting: *Deleted

## 2018-10-04 ENCOUNTER — Ambulatory Visit (HOSPITAL_COMMUNITY)
Admission: RE | Admit: 2018-10-04 | Discharge: 2018-10-04 | Disposition: A | Discharge status: Home / Self Care | Payer: PRIVATE HEALTH INSURANCE | Source: Ambulatory Visit | Attending: Cardiology | Admitting: Cardiology

## 2018-10-04 ENCOUNTER — Encounter (HOSPITAL_COMMUNITY): Payer: Self-pay

## 2018-10-04 DIAGNOSIS — R079 Chest pain, unspecified: Secondary | ICD-10-CM | POA: Insufficient documentation

## 2018-10-04 DIAGNOSIS — I10 Essential (primary) hypertension: Secondary | ICD-10-CM

## 2018-10-04 DIAGNOSIS — Z006 Encounter for examination for normal comparison and control in clinical research program: Secondary | ICD-10-CM

## 2018-10-04 DIAGNOSIS — E785 Hyperlipidemia, unspecified: Secondary | ICD-10-CM

## 2018-10-04 LAB — INSULIN, RANDOM: Insulin: 28.5 u[IU]/mL — ABNORMAL HIGH

## 2018-10-04 MED ORDER — NITROGLYCERIN 0.4 MG SL SUBL: 0.8000 mg | SUBLINGUAL_TABLET | Freq: Once | SUBLINGUAL | Status: DC

## 2018-10-04 MED ORDER — METOPROLOL TARTRATE 5 MG/5ML IV SOLN
5.0000 mg | INTRAVENOUS | Status: DC | PRN
  Administered 2018-10-04 (×2): 5 mg via INTRAVENOUS

## 2018-10-04 MED ORDER — METOPROLOL TARTRATE 5 MG/5ML IV SOLN
INTRAVENOUS | Status: AC
  Administered 2018-10-04: 5 mg via INTRAVENOUS
  Filled 2018-10-04: qty 10

## 2018-10-04 NOTE — Research (Signed)
CADFEM Informed Consent                  Subject Name:   Sarah Mcdowell   Subject met inclusion and exclusion criteria.  The informed consent form, study requirements and expectations were reviewed with the subject and questions and concerns were addressed prior to the signing of the consent form.  The subject verbalized understanding of the trial requirements.  The subject agreed to participate in the CADFEM trial and signed the informed consent.  The informed consent was obtained prior to performance of any protocol-specific procedures for the subject.  A copy of the signed informed consent was given to the subject and a copy was placed in the subject's medical record.   [place] Chalmers, Research Assistant  10/04/2018  11:29 a.m.

## 2018-10-04 NOTE — Sedation Documentation (Signed)
Patient reports she feels to anxious to perform the CT scan today.  Patient reports she would like to reschedule and see the CT scanner.  Patient walked to CT scanner and shown what will be required for CT heart.  Patient walked to lobby and notified she will be called to reschedule.  Patient voiced understanding.

## 2018-11-03 ENCOUNTER — Telehealth: Payer: Self-pay | Admitting: Cardiology

## 2018-11-03 NOTE — Telephone Encounter (Signed)
States that she has called several times to have her FMLA forms filled out by [facility] because [employer] did not do them correctly

## 2018-11-04 NOTE — Telephone Encounter (Signed)
Spoke with Sarah Mcdowell who is going to look into the status of patient's [employer] paperwork. Advised patient our office will be in touch with updates. Patient reports her cut off date to have the paperwork completed is 11/17/2018.

## 2018-11-08 ENCOUNTER — Ambulatory Visit: Payer: PRIVATE HEALTH INSURANCE | Admitting: Cardiology

## 2018-11-14 NOTE — Telephone Encounter (Signed)
Patient informed that the missing information for her FMLA paperwork is from another doctor's office. Our office has sent in all needed information. Patient verbalized understanding. No further questions.
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
## 2018-11-22 ENCOUNTER — Other Ambulatory Visit: Payer: Self-pay

## 2018-11-22 ENCOUNTER — Encounter: Payer: Self-pay | Admitting: Cardiology

## 2018-11-22 ENCOUNTER — Ambulatory Visit (INDEPENDENT_AMBULATORY_CARE_PROVIDER_SITE_OTHER): Payer: PRIVATE HEALTH INSURANCE | Admitting: Cardiology

## 2018-11-22 VITALS — BP 120/64 | HR 88 | Ht 67.5 in | Wt 247.5 lb

## 2018-11-22 DIAGNOSIS — R079 Chest pain, unspecified: Secondary | ICD-10-CM

## 2018-11-22 DIAGNOSIS — I10 Essential (primary) hypertension: Secondary | ICD-10-CM | POA: Diagnosis not present

## 2018-11-22 NOTE — Patient Instructions (Signed)
Medication Instructions:  Your physician recommends that you continue on your current medications as directed. Please refer to the Current Medication list given to you today.  If you need a refill on your cardiac medications before your next appointment, please call your pharmacy.   Lab work: None  If you have labs (blood work) drawn today and your tests are completely normal, you will receive your results only by: Marland Kitchen MyChart Message (if you have MyChart) OR . A paper copy in the mail If you have any lab test that is abnormal or we need to change your treatment, we will call you to review the results.  Testing/Procedures: Your physician has requested that you have a stress echocardiogram. For further information please visit [email or web address]. Please follow instruction sheet as given.  Follow-Up: At [facility], you and your health needs are our priority.  As part of our continuing mission to provide you with exceptional heart care, we have created designated Provider Care Teams.  These Care Teams include your primary Cardiologist (physician) and Advanced Practice Providers (APPs -  Physician Assistants and Nurse Practitioners) who all work together to provide you with the care you need, when you need it. You will need a follow up appointment in 3 months.       Exercise Stress Test An exercise stress test is a test to check how your heart works during exercise. You will need to walk on a treadmill or ride an exercise bike for this test. An electrocardiogram (ECG) will record your heartbeat when you are at rest and when you are exercising. You may have an ultrasound or nuclear test after the exercise test. The test is done to check for coronary artery disease (CAD). It is also done to:  See how well you can exercise.  Watch for high blood pressure during exercise.  Test how well you can exercise after treatment.  Check the blood flow to your arms and legs. If your test result is  not normal, more testing may be needed. What happens before the procedure?  Follow instructions from your doctor about what you cannot eat or drink. ? Do not have any drinks or foods that have caffeine in them for 24 hours before the test, or as told by your doctor. This includes coffee, tea (even decaf tea), sodas, chocolate, and cocoa.  Ask your doctor about changing or stopping your normal medicines. This is important if you: ? Take diabetes medicines. ? Take beta-blocker medicines. ? Wear a nitroglycerin patch.  If you use an inhaler, bring it with you to the test.  Do not put lotions, powders, creams, or oils on your chest before the test.  Wear comfortable shoes and clothing.  Do not use any products that have nicotine or tobacco in them, such as cigarettes and e-cigarettes. Stop using them at least 4 hours before the test. If you need help quitting, ask your doctor. What happens during the procedure?   Patches (electrodes) will be put on your chest.  Wires will be connected to the patches. The wires will send signals to a machine to record your heartbeat.  Your heart rate will be watched while you are resting and while you are exercising. Your blood pressure will also be watched during the test.  You will walk on a treadmill or use a stationary bike. If you cannot use these, you may be asked to turn a crank with your hands.  The activity will get harder and will raise your heart  rate.  You may be asked to breathe into a tube a few times during the test. This measures the gases that you breathe out.  You will be asked how you are feeling throughout the test.  You will exercise until your heart reaches a target heart rate. You will stop early if: ? You feel dizzy. ? You have chest pain. ? You are out of breath. ? Your blood pressure is too high or too low. ? You have an irregular heartbeat. ? You have pain or aching in your arms or legs. The procedure may vary among  doctors and hospitals. What happens after the procedure?  Your blood pressure, heart rate, breathing rate, and blood oxygen level will be watched after the test.  You may return to your normal diet and activities as told by your doctor.  It is up to you to get the results of your test. Ask your doctor, or the department that is doing the test, when your results will be ready. Summary  An exercise stress test is a test to check how your heart works during exercise.  This test is done to check for coronary artery disease.  Your heart rate will be watched while you are resting and while you are exercising.  Follow instructions from your doctor about what you cannot eat or drink before the test. This information is not intended to replace advice given to you by your health care provider. Make sure you discuss any questions you have with your health care provider. Document Released: 08/26/2007 Document Revised: 06/21/2018 Document Reviewed: 06/09/2016 Elsevier Patient Education  2020 [employer].

## 2018-11-24 ENCOUNTER — Telehealth: Payer: Self-pay | Admitting: *Deleted

## 2018-11-24 NOTE — Telephone Encounter (Signed)
Called patient and scheduled her for COVID-19 testing prior to her stress echo on Saturday, 12/03/2018, at 12:25 pm at the [facility] in [place]. Patient is agreeable and provided with address of location. Patient verbalized understanding. No further questions.

## 2018-11-24 NOTE — Telephone Encounter (Signed)
-----   Message from Marcine Matar sent at 11/23/2018  4:00 PM EDT ----- Regarding: COVID test Good Day, The patient is asking for Saturday COVID testing please due to children at home.   There is a stress echo scheduled on Tuesday, December 06, 2018, @ 2:00 pm patient of Dr. Bettina Gavia name Sarah Mcdowell.  Please call the patient and schedule/order COVID testing  [facility] the patient is aware the nurse will be calling to set up COVID testing and is aware to self-isolate after the COVID testing is performed until the stress echo appointment.  If the patient does not have the COVID testing performed and if we don't have the testing results back prior to the stress echo appointment, we will have to cancel the test.  Jari Sportsman

## 2018-12-01 ENCOUNTER — Telehealth (HOSPITAL_COMMUNITY): Payer: Self-pay

## 2018-12-01 ENCOUNTER — Other Ambulatory Visit (HOSPITAL_COMMUNITY): Payer: Self-pay

## 2018-12-01 ENCOUNTER — Other Ambulatory Visit (HOSPITAL_COMMUNITY): Payer: PRIVATE HEALTH INSURANCE

## 2018-12-01 NOTE — Telephone Encounter (Signed)
Instructions for the patient's stress echo was left on the patient's answering machine. Asked to call back with any questions. S.Santresa Levett EMTP

## 2018-12-03 ENCOUNTER — Other Ambulatory Visit (HOSPITAL_COMMUNITY)
Admission: RE | Admit: 2018-12-03 | Discharge: 2018-12-03 | Disposition: A | Discharge status: Home / Self Care | Payer: PRIVATE HEALTH INSURANCE | Source: Ambulatory Visit | Attending: Cardiology | Admitting: Cardiology

## 2018-12-03 DIAGNOSIS — Z01812 Encounter for preprocedural laboratory examination: Secondary | ICD-10-CM | POA: Insufficient documentation

## 2018-12-03 DIAGNOSIS — Z20828 Contact with and (suspected) exposure to other viral communicable diseases: Secondary | ICD-10-CM | POA: Insufficient documentation

## 2018-12-04 LAB — NOVEL CORONAVIRUS, NAA (HOSP ORDER, SEND-OUT TO REF LAB; TAT 18-24 HRS): SARS-CoV-2, NAA: NOT DETECTED

## 2018-12-06 ENCOUNTER — Ambulatory Visit (HOSPITAL_COMMUNITY): Payer: PRIVATE HEALTH INSURANCE | Attending: Cardiology

## 2018-12-06 ENCOUNTER — Other Ambulatory Visit: Payer: Self-pay

## 2018-12-06 ENCOUNTER — Ambulatory Visit (HOSPITAL_BASED_OUTPATIENT_CLINIC_OR_DEPARTMENT_OTHER): Payer: PRIVATE HEALTH INSURANCE

## 2018-12-06 DIAGNOSIS — R079 Chest pain, unspecified: Secondary | ICD-10-CM | POA: Diagnosis present

## 2018-12-06 DIAGNOSIS — I10 Essential (primary) hypertension: Secondary | ICD-10-CM | POA: Insufficient documentation

## 2019-01-02 ENCOUNTER — Ambulatory Visit: Payer: PRIVATE HEALTH INSURANCE | Admitting: Family Medicine

## 2019-03-21 ENCOUNTER — Other Ambulatory Visit: Payer: Self-pay | Admitting: Cardiology

## 2019-05-03 ENCOUNTER — Other Ambulatory Visit: Payer: Self-pay

## 2019-05-03 ENCOUNTER — Ambulatory Visit (INDEPENDENT_AMBULATORY_CARE_PROVIDER_SITE_OTHER): Payer: BC Managed Care – PPO | Admitting: Cardiology

## 2019-05-03 ENCOUNTER — Encounter: Payer: Self-pay | Admitting: Cardiology

## 2019-05-03 VITALS — BP 142/88 | HR 72 | Ht 67.0 in | Wt 249.0 lb

## 2019-05-03 DIAGNOSIS — R0789 Other chest pain: Secondary | ICD-10-CM

## 2019-05-03 DIAGNOSIS — I10 Essential (primary) hypertension: Secondary | ICD-10-CM | POA: Diagnosis not present

## 2019-05-03 MED ORDER — CELECOXIB 100 MG PO CAPS: ORAL_CAPSULE | ORAL | 0 refills | Status: DC

## 2019-05-03 NOTE — Patient Instructions (Addendum)
Medication Instructions:  Your physician has recommended you make the following change in your medication:  1.  START Celebrex 100 mg taking 1 tablet twice a day for 14 days then stop and use Ibuprofen / Aleve only as needed .  *If you need a refill on your cardiac medications before your next appointment, please call your pharmacy*  Lab Work: COME BACK NEXT Tuesday, 05/10/2019 FOR A BMET  If you have labs (blood work) drawn today and your tests are completely normal, you will receive your results only by: Marland Kitchen MyChart Message (if you have MyChart) OR . A paper copy in the mail If you have any lab test that is abnormal or we need to change your treatment, we will call you to review the results.  Testing/Procedures: None ordere  Follow-Up: At [facility], you and your health needs are our priority.  As part of our continuing mission to provide you with exceptional heart care, we have created designated Provider Care Teams.  These Care Teams include your primary Cardiologist (physician) and Advanced Practice Providers (APPs -  Physician Assistants and Nurse Practitioners) who all work together to provide you with the care you need, when you need it.  Your next appointment:   6 month(s)  The format for your next appointment:   In Person  Provider:   Shirlee More, MD
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
## 2019-05-04 ENCOUNTER — Other Ambulatory Visit: Payer: Self-pay | Admitting: Cardiology

## 2019-05-04 NOTE — Telephone Encounter (Signed)
Pt states no diuretic was sent to [facility] for her to start. She states she was advised by Dr. Bettina Gavia to start diuretic then return to office for BMP. Per last OV note pt was to start on an MRA but none ordered. Will route encounter

## 2019-05-04 NOTE — Telephone Encounter (Signed)
Patient is calling to inquire about whether or not Dr. Bettina Gavia advises that the patient takes Diuretics medication. Please call to advise.

## 2019-05-05 MED ORDER — EPLERENONE 25 MG PO TABS: 25.0000 mg | ORAL_TABLET | Freq: Every day | ORAL | 3 refills | Status: DC

## 2019-05-05 NOTE — Telephone Encounter (Signed)
Inspra 25 mg day #30 refill 3

## 2019-05-05 NOTE — Telephone Encounter (Signed)
Inspra 25 mg #30 refill 3 per Dr Bettina Gavia called in to [facility]. Patient called and informed.

## 2019-05-13 LAB — BASIC METABOLIC PANEL
BUN/Creatinine Ratio: 18 (ref 9–23)
BUN: 15 mg/dL (ref 6–24)
CO2: 21 mmol/L (ref 20–29)
Calcium: 9.3 mg/dL (ref 8.7–10.2)
Chloride: 103 mmol/L (ref 96–106)
Creatinine, Ser: 0.82 mg/dL (ref 0.57–1.00)
GFR calc Af Amer: 100 mL/min/{1.73_m2} (ref >59)
GFR calc non Af Amer: 87 mL/min/{1.73_m2} (ref >59)
Glucose: 83 mg/dL (ref 65–99)
Potassium: 4.2 mmol/L (ref 3.5–5.2)
Sodium: 141 mmol/L (ref 134–144)

## 2019-06-04 IMAGING — DX DG CHEST 2V
2 series · 2 of 2 positions shown · non-contrast
Comparison: None.

CLINICAL DATA: Chest pain

EXAM:
CHEST - 2 VIEW

[w chest pa]
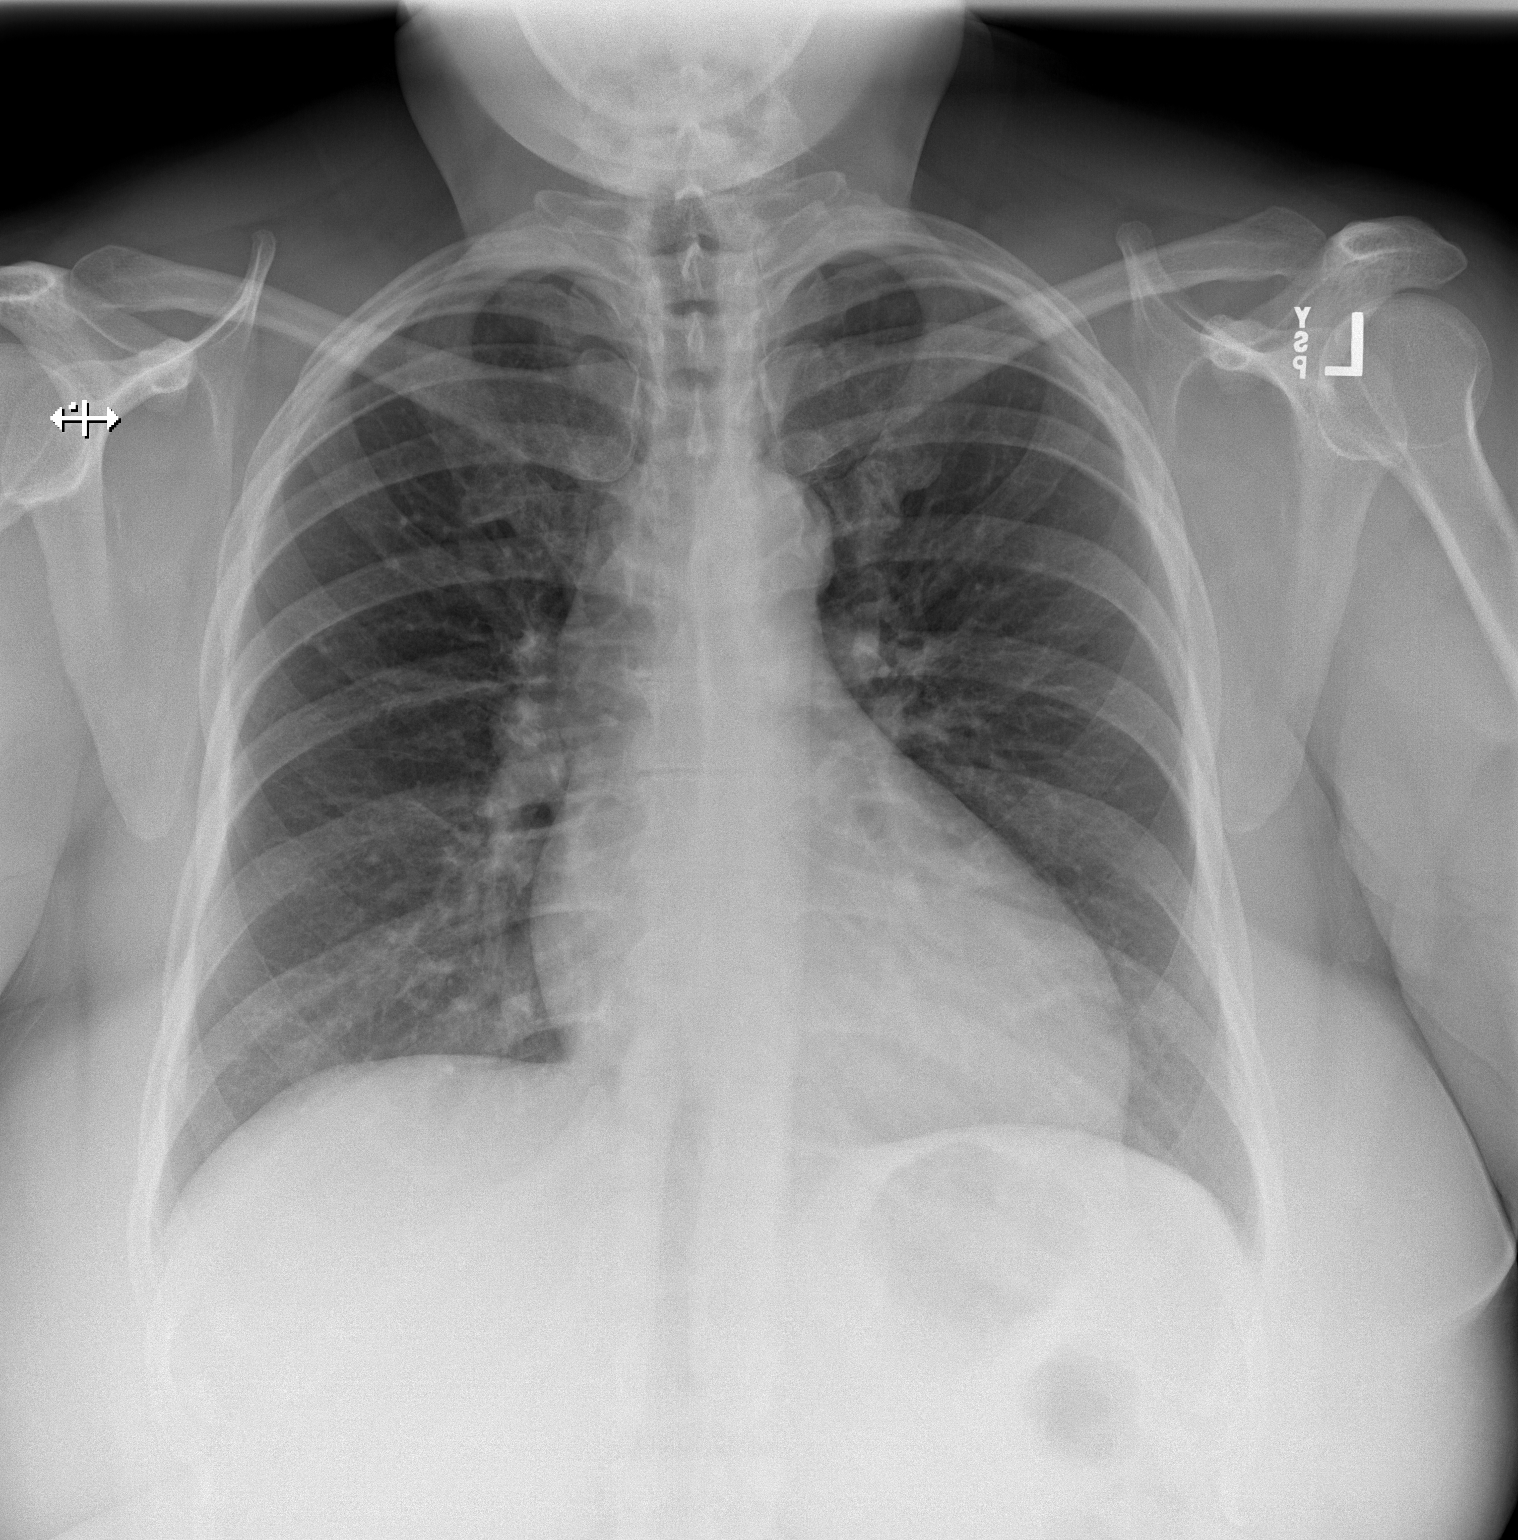

[w chest lat]
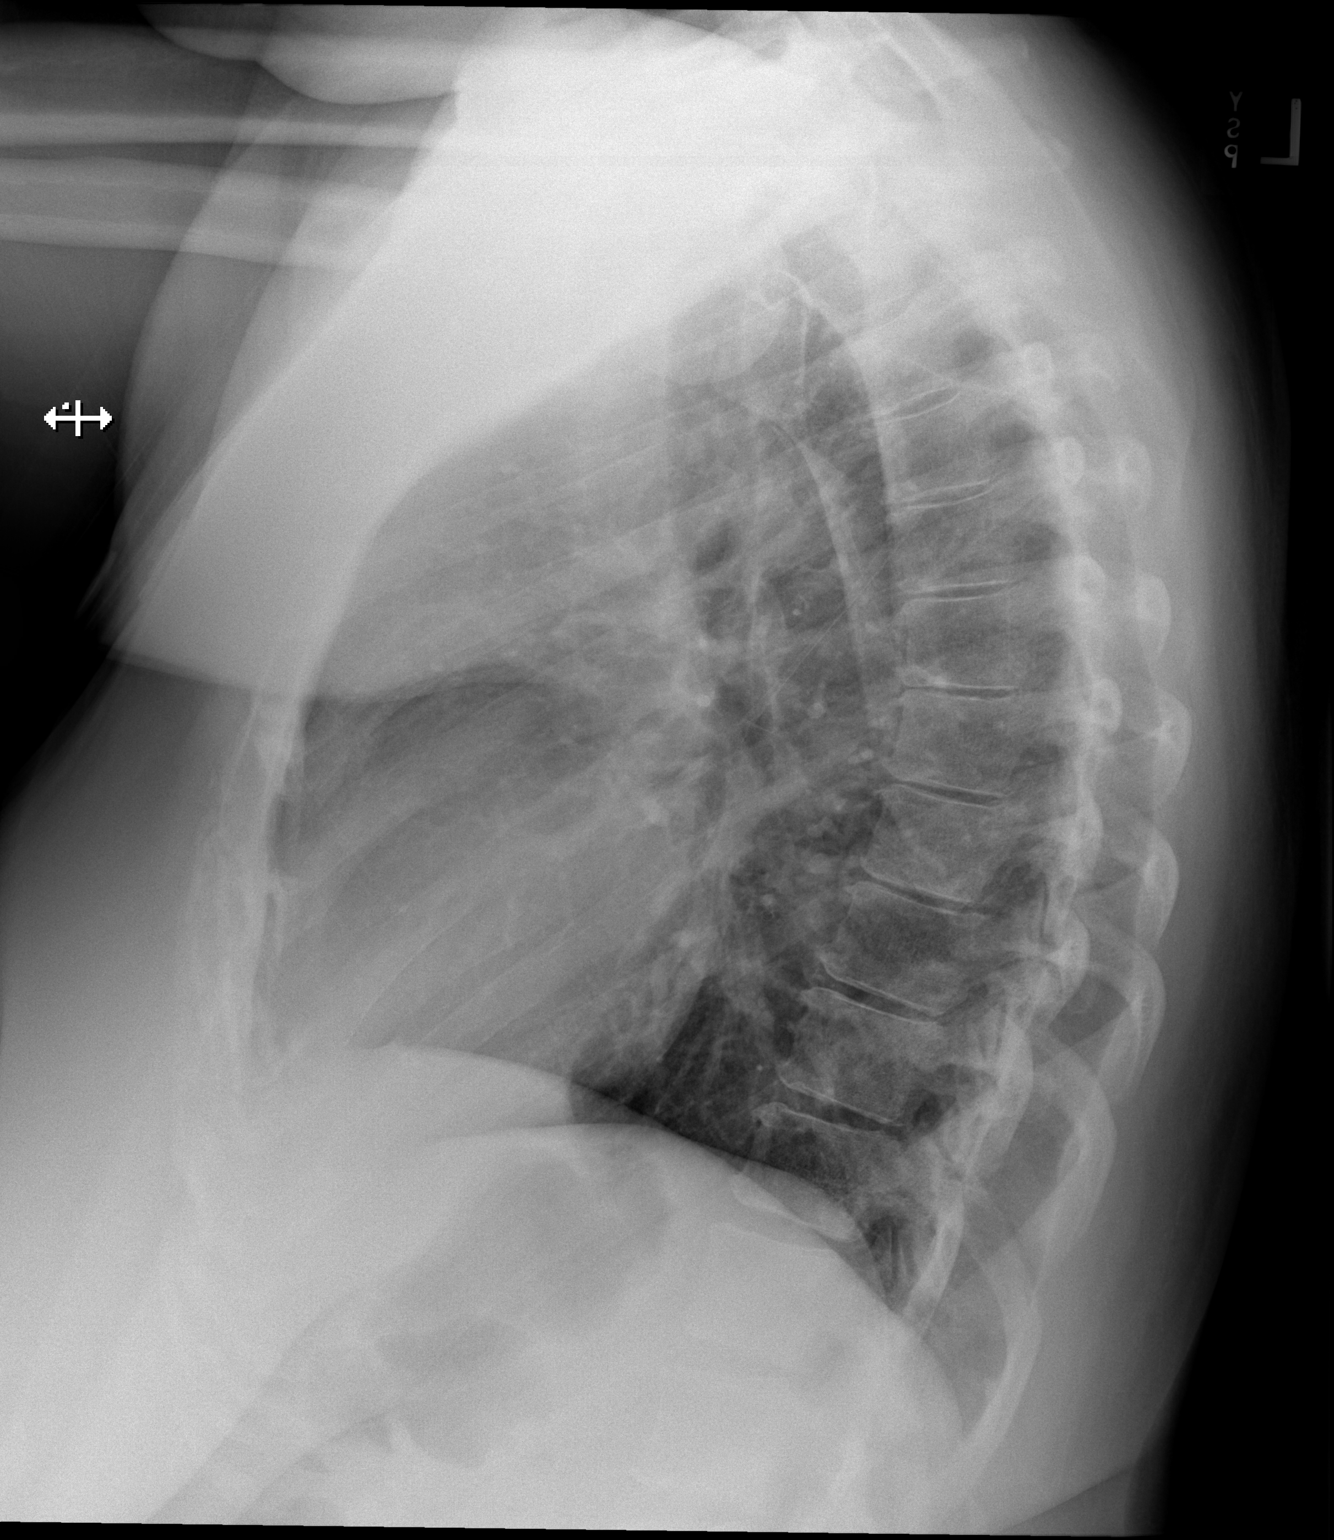

[2 of 2 positions shown; findings below may reference images not displayed]

FINDINGS: Heart and mediastinal contours are within normal limits. No focal
opacities or effusions. No acute bony abnormality.
IMPRESSION: No active cardiopulmonary disease.

## 2019-06-27 ENCOUNTER — Telehealth: Payer: Self-pay | Admitting: Cardiology

## 2019-06-27 NOTE — Telephone Encounter (Signed)
Spoke with patient and let her know that Dr. Bettina Gavia recommends OTC either plain Zyrtec or Claritin. No combo medications.   Patient verbalizes understanding.    Encouraged patient to call back with any questions or concerns.

## 2019-06-27 NOTE — Telephone Encounter (Signed)
New message  Patient is calling in to see if she is able to take any allergy medications with her current medications. Please give patient a call back to discuss. If no answer, please leave a message with allergy medication recommendations per patient.

## 2019-07-26 ENCOUNTER — Other Ambulatory Visit: Payer: Self-pay | Admitting: Cardiology

## 2020-03-01 DIAGNOSIS — Z87440 Personal history of urinary (tract) infections: Secondary | ICD-10-CM | POA: Insufficient documentation

## 2020-03-01 DIAGNOSIS — E669 Obesity, unspecified: Secondary | ICD-10-CM | POA: Insufficient documentation

## 2020-03-01 DIAGNOSIS — D649 Anemia, unspecified: Secondary | ICD-10-CM | POA: Insufficient documentation

## 2020-03-01 DIAGNOSIS — T7840XA Allergy, unspecified, initial encounter: Secondary | ICD-10-CM | POA: Insufficient documentation

## 2020-03-01 DIAGNOSIS — I10 Essential (primary) hypertension: Secondary | ICD-10-CM | POA: Insufficient documentation

## 2020-03-01 DIAGNOSIS — D219 Benign neoplasm of connective and other soft tissue, unspecified: Secondary | ICD-10-CM | POA: Insufficient documentation

## 2020-03-01 DIAGNOSIS — O09529 Supervision of elderly multigravida, unspecified trimester: Secondary | ICD-10-CM | POA: Insufficient documentation

## 2020-03-26 ENCOUNTER — Ambulatory Visit: Payer: BC Managed Care – PPO | Admitting: Cardiology

## 2020-03-29 ENCOUNTER — Ambulatory Visit: Payer: BC Managed Care – PPO | Admitting: Cardiology
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
## 2020-04-16 ENCOUNTER — Other Ambulatory Visit: Payer: Self-pay

## 2020-04-16 ENCOUNTER — Ambulatory Visit (INDEPENDENT_AMBULATORY_CARE_PROVIDER_SITE_OTHER): Payer: BC Managed Care – PPO | Admitting: Cardiology

## 2020-04-16 ENCOUNTER — Encounter: Payer: Self-pay | Admitting: Cardiology

## 2020-04-16 VITALS — BP 154/90 | HR 86 | Ht 67.0 in | Wt 239.0 lb

## 2020-04-16 DIAGNOSIS — R0789 Other chest pain: Secondary | ICD-10-CM

## 2020-04-16 DIAGNOSIS — I10 Essential (primary) hypertension: Secondary | ICD-10-CM

## 2020-04-16 MED ORDER — EPLERENONE 25 MG PO TABS: 25.0000 mg | ORAL_TABLET | Freq: Every day | ORAL | 3 refills | Status: DC

## 2020-04-16 MED ORDER — AMLODIPINE BESYLATE 5 MG PO TABS: 5.0000 mg | ORAL_TABLET | Freq: Every day | ORAL | 3 refills | Status: DC

## 2020-04-16 NOTE — Patient Instructions (Signed)

## 2020-04-22 ENCOUNTER — Telehealth: Payer: Self-pay | Admitting: Cardiology

## 2020-04-22 MED ORDER — SPIRONOLACTONE 25 MG PO TABS: 25.0000 mg | ORAL_TABLET | Freq: Every day | ORAL | 3 refills | Status: DC

## 2020-04-22 NOTE — Telephone Encounter (Signed)
She could use spironolactone 25 mg/day

## 2020-04-22 NOTE — Telephone Encounter (Signed)
Spoke to patient just now and let her know Dr. Munley's recommendations. She verbalizes understanding and thanks me for the call back.  

## 2020-04-22 NOTE — Telephone Encounter (Signed)
Pt c/o medication issue:  1. Name of Medication: eplerenone (INSPRA) 25 MG tablet    2. How are you currently taking this medication (dosage and times per day)? As prescribed.  3. Are you having a reaction (difficulty breathing--STAT)? No.  4. What is your medication issue? Patient states that this medication isn't covered by her insurance and was wondering if Dr. Bettina Gavia had a suggestion for an alternative. Please advise.

## 2020-10-16 ENCOUNTER — Other Ambulatory Visit: Payer: Self-pay

## 2020-10-17 ENCOUNTER — Encounter: Payer: Self-pay | Admitting: Family Medicine

## 2020-10-17 ENCOUNTER — Ambulatory Visit (INDEPENDENT_AMBULATORY_CARE_PROVIDER_SITE_OTHER): Payer: BC Managed Care – PPO | Admitting: Family Medicine

## 2020-10-17 VITALS — BP 134/82 | HR 97 | Temp 98.9°F | Resp 18 | Ht 67.0 in | Wt 249.8 lb

## 2020-10-17 DIAGNOSIS — Z23 Encounter for immunization: Secondary | ICD-10-CM

## 2020-10-17 DIAGNOSIS — I10 Essential (primary) hypertension: Secondary | ICD-10-CM

## 2020-10-17 DIAGNOSIS — Z1159 Encounter for screening for other viral diseases: Secondary | ICD-10-CM

## 2020-10-17 DIAGNOSIS — R011 Cardiac murmur, unspecified: Secondary | ICD-10-CM

## 2020-10-17 DIAGNOSIS — Z1211 Encounter for screening for malignant neoplasm of colon: Secondary | ICD-10-CM | POA: Diagnosis not present

## 2020-10-17 DIAGNOSIS — Z1231 Encounter for screening mammogram for malignant neoplasm of breast: Secondary | ICD-10-CM

## 2020-10-17 DIAGNOSIS — Z0000 Encounter for general adult medical examination without abnormal findings: Secondary | ICD-10-CM | POA: Diagnosis not present

## 2020-10-17 DIAGNOSIS — I5189 Other ill-defined heart diseases: Secondary | ICD-10-CM

## 2020-10-17 MED ORDER — SPIRONOLACTONE 25 MG PO TABS: 25.0000 mg | ORAL_TABLET | Freq: Every day | ORAL | 3 refills | Status: DC

## 2020-10-17 MED ORDER — AMLODIPINE BESYLATE 5 MG PO TABS: 5.0000 mg | ORAL_TABLET | Freq: Every day | ORAL | 3 refills | Status: DC
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
## 2020-10-17 NOTE — Assessment & Plan Note (Signed)
Well controlled, no changes to meds. Encouraged heart healthy diet such as the DASH diet and exercise as tolerated.  °

## 2020-10-17 NOTE — Assessment & Plan Note (Signed)
-   Refer to healthy weight and wellness 

## 2020-10-17 NOTE — Patient Instructions (Signed)
Preventive Care 69-47 Years Old, Female Preventive care refers to lifestyle choices and visits with your health care provider that can promote health and wellness. This includes: A yearly physical exam. This is also called an annual wellness visit. Regular dental and eye exams. Immunizations. Screening for certain conditions. Healthy lifestyle choices, such as: Eating a healthy diet. Getting regular exercise. Not using drugs or products that contain nicotine and tobacco. Limiting alcohol use. What can I expect for my preventive care visit? Physical exam Your health care provider will check your: Height and weight. These may be used to calculate your BMI (body mass index). BMI is a measurement that tells if you are at a healthy weight. Heart rate and blood pressure. Body temperature. Skin for abnormal spots. Counseling Your health care provider may ask you questions about your: Past medical problems. Family's medical history. Alcohol, tobacco, and drug use. Emotional well-being. Home life and relationship well-being. Sexual activity. Diet, exercise, and sleep habits. Work and work Statistician. Access to firearms. Method of birth control. Menstrual cycle. Pregnancy history. What immunizations do I need?  Vaccines are usually given at various ages, according to a schedule. Your health care provider will recommend vaccines for you based on your age, medicalhistory, and lifestyle or other factors, such as travel or where you work. What tests do I need? Blood tests Lipid and cholesterol levels. These may be checked every 5 years, or more often if you are over 28 years old. Hepatitis C test. Hepatitis B test. Screening Lung cancer screening. You may have this screening every year starting at age 27 if you have a 30-pack-year history of smoking and currently smoke or have quit within the past 15 years. Colorectal cancer screening. All adults should have this screening starting at  age 65 and continuing until age 19. Your health care provider may recommend screening at age 74 if you are at increased risk. You will have tests every 1-10 years, depending on your results and the type of screening test. Diabetes screening. This is done by checking your blood sugar (glucose) after you have not eaten for a while (fasting). You may have this done every 1-3 years. Mammogram. This may be done every 1-2 years. Talk with your health care provider about when you should start having regular mammograms. This may depend on whether you have a family history of breast cancer. BRCA-related cancer screening. This may be done if you have a family history of breast, ovarian, tubal, or peritoneal cancers. Pelvic exam and Pap test. This may be done every 3 years starting at age 59. Starting at age 5, this may be done every 5 years if you have a Pap test in combination with an HPV test. Other tests STD (sexually transmitted disease) testing, if you are at risk. Bone density scan. This is done to screen for osteoporosis. You may have this scan if you are at high risk for osteoporosis. Talk with your health care provider about your test results, treatment options,and if necessary, the need for more tests. Follow these instructions at home: Eating and drinking  Eat a diet that includes fresh fruits and vegetables, whole grains, lean protein, and low-fat dairy products. Take vitamin and mineral supplements as recommended by your health care provider. Do not drink alcohol if: Your health care provider tells you not to drink. You are pregnant, may be pregnant, or are planning to become pregnant. If you drink alcohol: Limit how much you have to 0-1 drink a day. Be aware  of how much alcohol is in your drink. In the [place] one drink equals one 12 oz bottle of beer (355 mL), one 5 oz glass of wine (148 mL), or one 1 oz glass of hard liquor (44 mL).  Lifestyle Take daily care of your teeth and  gums. Brush your teeth every morning and night with fluoride toothpaste. Floss one time each day. Stay active. Exercise for at least 30 minutes 5 or more days each week. Do not use any products that contain nicotine or tobacco, such as cigarettes, e-cigarettes, and chewing tobacco. If you need help quitting, ask your health care provider. Do not use drugs. If you are sexually active, practice safe sex. Use a condom or other form of protection to prevent STIs (sexually transmitted infections). If you do not wish to become pregnant, use a form of birth control. If you plan to become pregnant, see your health care provider for a prepregnancy visit. If told by your health care provider, take low-dose aspirin daily starting at age 29. Find healthy ways to cope with stress, such as: Meditation, yoga, or listening to music. Journaling. Talking to a trusted person. Spending time with friends and family. Safety Always wear your seat belt while driving or riding in a vehicle. Do not drive: If you have been drinking alcohol. Do not ride with someone who has been drinking. When you are tired or distracted. While texting. Wear a helmet and other protective equipment during sports activities. If you have firearms in your house, make sure you follow all gun safety procedures. What's next? Visit your health care provider once a year for an annual wellness visit. Ask your health care provider how often you should have your eyes and teeth checked. Stay up to date on all vaccines. This information is not intended to replace advice given to you by your health care provider. Make sure you discuss any questions you have with your healthcare provider. Document Revised: 12/12/2019 Document Reviewed: 11/18/2017 Elsevier Patient Education  2022 [employer].

## 2020-10-17 NOTE — Assessment & Plan Note (Signed)
Per cardiology 

## 2020-10-17 NOTE — Assessment & Plan Note (Signed)
ghm utd Check labs  

## 2020-10-18 LAB — COMPREHENSIVE METABOLIC PANEL
ALT: 20 U/L (ref 0–35)
AST: 17 U/L (ref 0–37)
Albumin: 4 g/dL (ref 3.5–5.2)
Alkaline Phosphatase: 72 U/L (ref 39–117)
BUN: 17 mg/dL (ref 6–23)
CO2: 26 mEq/L (ref 19–32)
Calcium: 9 mg/dL (ref 8.4–10.5)
Chloride: 103 mEq/L (ref 96–112)
Creatinine, Ser: 0.95 mg/dL (ref 0.40–1.20)
GFR: 71.45 mL/min (ref >60.00)
Glucose, Bld: 93 mg/dL (ref 70–99)
Potassium: 4 mEq/L (ref 3.5–5.1)
Sodium: 137 mEq/L (ref 135–145)
Total Bilirubin: 0.4 mg/dL (ref 0.2–1.2)
Total Protein: 7.4 g/dL (ref 6.0–8.3)

## 2020-10-18 LAB — CBC WITH DIFFERENTIAL/PLATELET
Basophils Absolute: 0.1 10*3/uL (ref 0.0–0.1)
Basophils Relative: 1.4 % (ref 0.0–3.0)
Eosinophils Absolute: 0.2 10*3/uL (ref 0.0–0.7)
Eosinophils Relative: 2.6 % (ref 0.0–5.0)
HCT: 35.6 % — ABNORMAL LOW (ref 36.0–46.0)
Hemoglobin: 11 g/dL — ABNORMAL LOW (ref 12.0–15.0)
Lymphocytes Relative: 35 % (ref 12.0–46.0)
Lymphs Abs: 2.8 10*3/uL (ref 0.7–4.0)
MCHC: 30.8 g/dL (ref 30.0–36.0)
MCV: 69.2 fl — ABNORMAL LOW (ref 78.0–100.0)
Monocytes Absolute: 0.6 10*3/uL (ref 0.1–1.0)
Monocytes Relative: 7.5 % (ref 3.0–12.0)
Neutro Abs: 4.3 10*3/uL (ref 1.4–7.7)
Neutrophils Relative %: 53.5 % (ref 43.0–77.0)
Platelets: 284 10*3/uL (ref 150.0–400.0)
RBC: 5.15 Mil/uL — ABNORMAL HIGH (ref 3.87–5.11)
RDW: 17.6 % — ABNORMAL HIGH (ref 11.5–15.5)
WBC: 8.1 10*3/uL (ref 4.0–10.5)

## 2020-10-18 LAB — HEPATITIS C ANTIBODY
Hepatitis C Ab: NONREACTIVE
SIGNAL TO CUT-OFF: 0.03 (ref <1.00)

## 2020-10-18 LAB — TSH: TSH: 1.09 u[IU]/mL (ref 0.35–5.50)

## 2020-10-18 LAB — LIPID PANEL
Cholesterol: 254 mg/dL — ABNORMAL HIGH (ref 0–200)
HDL: 42.4 mg/dL (ref >39.00)
NonHDL: 211.63
Total CHOL/HDL Ratio: 6
Triglycerides: 203 mg/dL — ABNORMAL HIGH (ref 0.0–149.0)
VLDL: 40.6 mg/dL — ABNORMAL HIGH (ref 0.0–40.0)

## 2020-10-18 LAB — LDL CHOLESTEROL, DIRECT: Direct LDL: 176 mg/dL

## 2020-10-20 ENCOUNTER — Other Ambulatory Visit (INDEPENDENT_AMBULATORY_CARE_PROVIDER_SITE_OTHER): Payer: BC Managed Care – PPO | Admitting: Family Medicine

## 2020-10-20 DIAGNOSIS — E785 Hyperlipidemia, unspecified: Secondary | ICD-10-CM | POA: Diagnosis not present

## 2020-10-20 DIAGNOSIS — D649 Anemia, unspecified: Secondary | ICD-10-CM

## 2020-10-25 ENCOUNTER — Other Ambulatory Visit: Payer: Self-pay

## 2020-10-25 ENCOUNTER — Ambulatory Visit
Admission: RE | Admit: 2020-10-25 | Discharge: 2020-10-25 | Disposition: A | Discharge status: Home / Self Care | Payer: BC Managed Care – PPO | Source: Ambulatory Visit | Attending: Family Medicine | Admitting: Family Medicine

## 2020-10-25 DIAGNOSIS — Z1231 Encounter for screening mammogram for malignant neoplasm of breast: Secondary | ICD-10-CM

## 2020-11-12 ENCOUNTER — Other Ambulatory Visit: Payer: Self-pay

## 2020-11-12 ENCOUNTER — Ambulatory Visit (HOSPITAL_BASED_OUTPATIENT_CLINIC_OR_DEPARTMENT_OTHER)
Admission: RE | Admit: 2020-11-12 | Discharge: 2020-11-12 | Disposition: A | Discharge status: Home / Self Care | Payer: BC Managed Care – PPO | Source: Ambulatory Visit | Attending: Family Medicine | Admitting: Family Medicine

## 2020-11-12 DIAGNOSIS — R011 Cardiac murmur, unspecified: Secondary | ICD-10-CM | POA: Diagnosis not present

## 2020-11-12 DIAGNOSIS — I10 Essential (primary) hypertension: Secondary | ICD-10-CM | POA: Diagnosis not present

## 2020-11-12 LAB — ECHOCARDIOGRAM COMPLETE
AR max vel: 2.14 cm2
AV Area VTI: 2.1 cm2
AV Area mean vel: 2.19 cm2
AV Mean grad: 4 mmHg
AV Peak grad: 8.1 mmHg
Ao pk vel: 1.42 m/s
Area-P 1/2: 4.24 cm2
Calc EF: 61.5 %
Single Plane A2C EF: 68.6 %
Single Plane A4C EF: 52.8 %

## 2020-11-12 NOTE — Progress Notes (Signed)
*  PRELIMINARY RESULTS* Echocardiogram 2D Echocardiogram has been performed.  Luisa Hart RDCS 11/12/2020, 7:58 AM

## 2021-05-06 ENCOUNTER — Encounter: Payer: Self-pay | Admitting: Family Medicine

## 2021-05-06 ENCOUNTER — Ambulatory Visit: Payer: BC Managed Care – PPO | Admitting: Family Medicine

## 2021-05-06 VITALS — BP 128/88 | HR 91 | Temp 98.6°F | Resp 20 | Ht 67.0 in | Wt 241.2 lb

## 2021-05-06 DIAGNOSIS — J40 Bronchitis, not specified as acute or chronic: Secondary | ICD-10-CM | POA: Diagnosis not present

## 2021-05-06 DIAGNOSIS — Z1211 Encounter for screening for malignant neoplasm of colon: Secondary | ICD-10-CM | POA: Diagnosis not present

## 2021-05-06 MED ORDER — METHYLPREDNISOLONE ACETATE 80 MG/ML IJ SUSP
80.0000 mg | Freq: Once | INTRAMUSCULAR | Status: AC
  Administered 2021-05-06: 80 mg via INTRAMUSCULAR

## 2021-05-06 MED ORDER — PROMETHAZINE-DM 6.25-15 MG/5ML PO SYRP: 5.0000 mL | ORAL_SOLUTION | Freq: Four times a day (QID) | ORAL | 0 refills | Status: DC | PRN

## 2021-05-06 MED ORDER — PREDNISONE 10 MG PO TABS: ORAL_TABLET | ORAL | 0 refills | Status: DC

## 2021-05-06 MED ORDER — AZITHROMYCIN 250 MG PO TABS: ORAL_TABLET | ORAL | 0 refills | Status: DC

## 2021-05-06 NOTE — Patient Instructions (Signed)
Acute Bronchitis, Adult °Acute bronchitis is sudden inflammation of the main airways (bronchi) that come off the windpipe (trachea) in the lungs. The swelling causes the airways to get smaller and make more mucus than normal. This can make it hard to breathe and can cause coughing or noisy breathing (wheezing). °Acute bronchitis may last several weeks. The cough may last longer. Allergies, asthma, and exposure to smoke may make the condition worse. °What are the causes? °This condition can be caused by germs and by substances that irritate the lungs, including: °Cold and flu viruses. The most common cause of this condition is the virus that causes the common cold. °Bacteria. This is less common. °Breathing in substances that irritate the lungs, including: °Smoke from cigarettes and other forms of tobacco. °Dust and pollen. °Fumes from household cleaning products, gases, or burned fuel. °Indoor or outdoor air pollution. °What increases the risk? °The following factors may make you more likely to develop this condition: °A weak body's defense system, also called the immune system. °A condition that affects your lungs and breathing, such as asthma. °What are the signs or symptoms? °Common symptoms of this condition include: °Coughing. This may bring up clear, yellow, or green mucus from your lungs (sputum). °Wheezing. °Runny or stuffy nose. °Having too much mucus in your lungs (chest congestion). °Shortness of breath. °Aches and pains, including sore throat or chest. °How is this diagnosed? °This condition is usually diagnosed based on: °Your symptoms and medical history. °A physical exam. °You may also have other tests, including tests to rule out other conditions, such as pneumonia. These tests include: °A test of lung function. °Test of a mucus sample to look for the presence of bacteria. °Tests to check the oxygen level in your blood. °Blood tests. °Chest X-ray. °How is this treated? °Most cases of acute bronchitis  clear up over time without treatment. Your health care provider may recommend: °Drinking more fluids to help thin your mucus so it is easier to cough up. °Taking inhaled medicine (inhaler) to improve air flow in and out of your lungs. °Using a vaporizer or a humidifier. These are machines that add water to the air to help you breathe better. °Taking a medicine that thins mucus and clears congestion (expectorant). °Taking a medicine that prevents or stops coughing (cough suppressant). °It is notcommon to take an antibiotic medicine for this condition. °Follow these instructions at home: ° °Take over-the-counter and prescription medicines only as told by your health care provider. °Use an inhaler, vaporizer, or humidifier as told by your health care provider. °Take two teaspoons (10 mL) of honey at bedtime to lessen coughing at night. °Drink enough fluid to keep your urine pale yellow. °Do not use any products that contain nicotine or tobacco. These products include cigarettes, chewing tobacco, and vaping devices, such as e-cigarettes. If you need help quitting, ask your health care provider. °Get plenty of rest. °Return to your normal activities as told by your health care provider. Ask your health care provider what activities are safe for you. °Keep all follow-up visits. This is important. °How is this prevented? °To lower your risk of getting this condition again: °Wash your hands often with soap and water for at least 20 seconds. If soap and water are not available, use hand sanitizer. °Avoid contact with people who have cold symptoms. °Try not to touch your mouth, nose, or eyes with your hands. °Avoid breathing in smoke or chemical fumes. Breathing smoke or chemical fumes will make your condition   worse. °Get the flu shot every year. °Contact a health care provider if: °Your symptoms do not improve after 2 weeks. °You have trouble coughing up the mucus. °Your cough keeps you awake at night. °You have a  fever. °Get help right away if you: °Cough up blood. °Feel pain in your chest. °Have severe shortness of breath. °Faint or keep feeling like you are going to faint. °Have a severe headache. °Have a fever or chills that get worse. °These symptoms may represent a serious problem that is an emergency. Do not wait to see if the symptoms will go away. Get medical help right away. Call your local emergency services (911 in the [place] Do not drive yourself to the hospital. °Summary °Acute bronchitis is inflammation of the main airways (bronchi) that come off the windpipe (trachea) in the lungs. The swelling causes the airways to get smaller and make more mucus than normal. °Drinking more fluids can help thin your mucus so it is easier to cough up. °Take over-the-counter and prescription medicines only as told by your health care provider. °Do not use any products that contain nicotine or tobacco. These products include cigarettes, chewing tobacco, and vaping devices, such as e-cigarettes. If you need help quitting, ask your health care provider. °Contact a health care provider if your symptoms do not improve after 2 weeks. °This information is not intended to replace advice given to you by your health care provider. Make sure you discuss any questions you have with your health care provider. °Document Revised: 07/10/2020 Document Reviewed: 07/10/2020 °Elsevier Patient Education © 2022 Elsevier Inc. ° °
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
## 2021-06-18 ENCOUNTER — Encounter: Payer: Self-pay | Admitting: Family Medicine

## 2021-06-18 ENCOUNTER — Ambulatory Visit: Payer: BC Managed Care – PPO | Admitting: Family Medicine

## 2021-06-18 VITALS — BP 132/86 | HR 74 | Temp 97.9°F | Ht 67.0 in | Wt 242.4 lb

## 2021-06-18 DIAGNOSIS — M7751 Other enthesopathy of right foot: Secondary | ICD-10-CM

## 2021-06-18 DIAGNOSIS — M7661 Achilles tendinitis, right leg: Secondary | ICD-10-CM | POA: Insufficient documentation

## 2021-06-18 MED ORDER — PREDNISONE 20 MG PO TABS: 40.0000 mg | ORAL_TABLET | Freq: Every day | ORAL | 0 refills | Status: AC

## 2021-06-18 NOTE — Progress Notes (Signed)
Musculoskeletal Exam ? ?Patient: Sarah Mcdowell DOB: 1975/05/16 ? ?DOS: 06/18/2021 ? ?SUBJECTIVE: ? ?Chief Complaint:  ? ?Chief Complaint  ?Patient presents with  ? Foot Pain  ?  Right ?  ? ? ?Sarah Mcdowell is a 46 y.o.  female for evaluation and treatment of R foot pain.  ? ?Onset:  12 months ago; worse in the past 3 d. No inj or change in activity recently; had started exercising more freq 1 yr ago.  ?Location: behind R foot ?Character:  shooting  ?Associated symptoms: difficulty walking, swelling ?Denies bruising, redness ?Treatment: to date has been massage and topical menthol.   ?Neurovascular symptoms: no ? ?Past Medical History:  ?Diagnosis Date  ? Allergy   ? seasonal  ? AMA (advanced maternal age) multigravida 96+   ? Anemia   ? Chest pain 09/12/2018  ? Diastolic dysfunction 6/75/9163  ? Fibroids   ? GERD (gastroesophageal reflux disease) 09/12/2018  ? Headache(784.0)   ? migraines  ? History of chlamydia 1992  ? History of cystitis   ? Hx of migraines 10/05/2014  ? Hypertension   ? Hypertensive disorder 01/22/2016  ? Migraine 03/12/2017  ? Obese   ? Obesity, morbid ([place]) 10/05/2014  ? Severe headache 12/29/2012  ? Uterine leiomyoma 05/25/2018  ? ? ?Objective: ?VITAL SIGNS: BP 132/86   Pulse 74   Temp 97.9 ?F (36.6 ?C) (Oral)   Ht '5\' 7"'$  (1.702 m)   Wt 242 lb 6 oz (109.9 kg)   SpO2 99%   BMI 37.96 kg/m?  ?Constitutional: Well formed, well developed. No acute distress. ?Thorax & Lungs: No accessory muscle use ?Musculoskeletal: R foot.   ?Tenderness to palpation: yes, over achilles tendon and very TTP over retrocalc bursa ?Deformity: no ?Ecchymosis: no ?Tests positive: none ?Tests negative: squeeze ?Neurologic: Normal sensory function. No focal deficits noted. Antalgic gait.  ?Psychiatric: Normal mood. Age appropriate judgment and insight. Alert & oriented x 3.   ? ?Assessment: ? ?Bursitis of right foot - Plan: predniSONE (DELTASONE) 20 MG tablet ? ?Achilles tendinitis of right lower  extremity ? ?Plan: ?Stretches/exercises, heat, ice, Tylenol. CAM walker given for ambulation. OK to stop when no pain with wt bearing. Will have her reach out in 2 weeks if no better. Will refer to sports med.  ?F/u as originally scheduled w reg PCP. ?The patient voiced understanding and agreement to the plan. ? ? ?[place], DO ?06/18/21  ?2:38 PM ? ?

## 2021-06-18 NOTE — Patient Instructions (Addendum)
Ice/cold pack over area for 10-15 min twice daily. ? ?OK to take Tylenol 1000 mg (2 extra strength tabs) or 975 mg (3 regular strength tabs) every 6 hours as needed. ? ?Wear the boot when you are walking. ? ?Send me a message or call in a couple weeks if no better.  ? ?Let us know if you need anything. ? ?Achilles Tendinitis Rehab ?It is normal to feel mild stretching, pulling, tightness, or discomfort as you do these exercises, but you should stop right away if you feel sudden pain or your pain gets worse.  ? ?Stretching and range of motion exercises ?These exercises warm up your muscles and joints and improve the movement and flexibility of your ankle. These exercises also help to relieve pain, numbness, and tingling. ?Exercise A: Standing wall calf stretch, knee straight ? ?Stand with your hands against a wall. ?Extend your affected leg behind you and bend your front knee slightly. Keep both of your heels on the floor. ?Point the toes of your back foot slightly inward. ?Keeping your heels on the floor and your back knee straight, shift your weight toward the wall. Do not allow your back to arch. You should feel a gentle stretch in your calf. ?Hold this position for seconds. ?Repeat 2 times. Complete this stretch 3 times per week ?Exercise B: Standing wall calf stretch, knee bent ?Stand with your hands against a wall. ?Extend your affected leg behind you, and bend your front knee slightly. Keep both of your heels on the floor. ?Point the toes of your back foot slightly inward. ?Keeping your heels on the floor, unlock your back knee so that it is bent. You should feel a gentle stretch deep in your calf. ?Hold this position for 30 seconds. ?Repeat 2 times. Complete this stretch 3 times per week. ? ?Strengthening exercises ?These exercises build strength and control of your ankle. Endurance is the ability to use your muscles for a long time, even after they get tired. ?Exercise C: Plantar flexion with band ? ?Sit on  the floor with your affected leg extended. You may put a pillow under your calf to give your foot more room to move. ?Loop a rubber exercise band or tube around the ball of your affected foot. The ball of your foot is on the walking surface, right under your toes. The band or tube should be slightly tense when your foot is relaxed. If the band or tube slips, you can put on your shoe or put a washcloth between the band and your foot to help it stay in place. ?Slowly point your toes downward, pushing them away from you. ?Hold this position for 10 seconds. ?Slowly release the tension in the band or tube, controlling smoothly until your foot is back to the starting position. ?Repeat 2 times. Complete this exercise 3 times per week. ?Exercise D: Heel raise with eccentric lower ? ?Stand on a step with the balls of your feet. The ball of your foot is on the walking surface, right under your toes. ?Do not put your heels on the step. ?For balance, rest your hands on the wall or on a railing. ?Rise up onto the balls of your feet. ?Keeping your heels up, shift all of your weight to your affected leg and pick up your other leg. ?Slowly lower your affected leg so your heel drops below the level of the step. ?Put down your foot. ?If told by your health care provider, build up to: ?3 sets of  15 repetitions while keeping your knees straight. ?3 sets of 15 repetitions while keeping your knees bent as far as told by your health care provider. ? ?Complete this exercise 3 times per week. ?If this exercise is too easy, try doing it while wearing a backpack with weights in it. ?Balance exercises ?These exercises improve or maintain your balance. Balance is important in preventing falls. ?Exercise E: Single leg stand ?Without shoes, stand near a railing or in a door frame. Hold on to the railing or door frame as needed. ?Stand on your affected foot. Keep your big toe down on the floor and try to keep your arch lifted. ?Hold this position  for 10 seconds. ?Repeat 2 times. Complete this exercise 3 times per week. ?If this exercise is too easy, you can try it with your eyes closed or while standing on a pillow. ?Make sure you discuss any questions you have with your health care provider. ?Document Released: 10/08/2004 Document Revised: 11/14/2015 Document Reviewed: 11/13/2014 ?Elsevier Interactive Patient Education ? 2018 [employer]. ? ?

## 2021-10-31 ENCOUNTER — Other Ambulatory Visit: Payer: Self-pay | Admitting: Family Medicine

## 2021-10-31 DIAGNOSIS — Z0000 Encounter for general adult medical examination without abnormal findings: Secondary | ICD-10-CM

## 2021-10-31 DIAGNOSIS — I10 Essential (primary) hypertension: Secondary | ICD-10-CM

## 2021-11-03 ENCOUNTER — Encounter: Payer: Self-pay | Admitting: Internal Medicine

## 2021-12-12 ENCOUNTER — Ambulatory Visit (AMBULATORY_SURGERY_CENTER): Payer: Self-pay

## 2021-12-12 ENCOUNTER — Other Ambulatory Visit: Payer: Self-pay

## 2021-12-12 VITALS — Ht 67.0 in | Wt 238.4 lb

## 2021-12-12 DIAGNOSIS — Z1211 Encounter for screening for malignant neoplasm of colon: Secondary | ICD-10-CM

## 2021-12-12 MED ORDER — NA SULFATE-K SULFATE-MG SULF 17.5-3.13-1.6 GM/177ML PO SOLN: 1.0000 | Freq: Once | ORAL | 0 refills | Status: AC

## 2021-12-12 NOTE — Progress Notes (Signed)
Denies allergies to eggs or soy products. Denies complication of anesthesia or sedation. Denies use of weight loss medication. Denies use of O2.   Emmi instructions given for colonoscopy.  

## 2022-01-01 ENCOUNTER — Encounter: Payer: Self-pay | Admitting: Internal Medicine

## 2022-01-06 ENCOUNTER — Telehealth: Payer: Self-pay | Admitting: Family Medicine

## 2022-01-06 DIAGNOSIS — Z1211 Encounter for screening for malignant neoplasm of colon: Secondary | ICD-10-CM

## 2022-01-06 NOTE — Telephone Encounter (Signed)
Pt called stating she is scheduled for a colonoscopy this Friday, but before having the procedure, she wants it clarified as to why she was not offered the Cologuard option vs. Colonoscopy.  Pt stated she does not like invasive procedures and tries to avoid them if at all possible and does not like being put to sleep.  Please advise.

## 2022-01-07 ENCOUNTER — Telehealth: Payer: Self-pay | Admitting: Internal Medicine

## 2022-01-07 NOTE — Telephone Encounter (Signed)
Patient said her PCP told her to was not needed will be doing the cologuard instead.

## 2022-01-07 NOTE — Telephone Encounter (Signed)
Spoke with patient. Pt was advised that she could do the Cologuard  but was advised if the cologuard comes back positive she will have to do the Colonoscopy and insurance may not cover it at that point. Pt stated "She will take her chances and will cancel her colon for Friday." Cologuard ordered.

## 2022-01-07 NOTE — Telephone Encounter (Signed)
Pt called. LVM advising to call back if she still needs clarification.

## 2022-01-09 ENCOUNTER — Encounter: Payer: BC Managed Care – PPO | Admitting: Internal Medicine

## 2022-06-18 ENCOUNTER — Other Ambulatory Visit: Payer: Self-pay | Admitting: Obstetrics and Gynecology

## 2022-06-18 DIAGNOSIS — N920 Excessive and frequent menstruation with regular cycle: Secondary | ICD-10-CM

## 2022-08-05 ENCOUNTER — Other Ambulatory Visit: Payer: Self-pay | Admitting: Obstetrics and Gynecology

## 2022-08-11 ENCOUNTER — Telehealth: Payer: Self-pay | Admitting: Family Medicine

## 2022-08-11 NOTE — Telephone Encounter (Signed)
Initial Comment Caller states her blood has been running high. She has a headache and the last reading was 171/89. Translation No Nurse Assessment Nurse: Lynn Ito, RN, Mardella Layman Date/Time (Eastern Time): 08/11/2022 4:29:00 PM Confirm and document reason for call. If symptomatic, describe symptoms. ---Caller states her blood has been running high. She has a headache, has subsided, and the last reading was 171/89. Caller states that she had stopped her BP meds by instruction her PCP. Does the patient have any new or worsening symptoms? ---Yes Will a triage be completed? ---Yes Related visit to physician within the last 2 weeks? ---No Does the PT have any chronic conditions? (i.e. diabetes, asthma, this includes High risk factors for pregnancy, etc.) ---No Is the patient pregnant or possibly pregnant? (Ask all females between the ages of 21-55) ---No Is this a behavioral health or substance abuse call? ---No Guidelines Guideline Title Affirmed Question Affirmed Notes Nurse Date/Time (Eastern Time) Blood Pressure - High Systolic BP >= 160 OR Diastolic >= 100 Popejoy, RN, Mardella Layman 08/11/2022 4:32:58 PM Disp. Time Lamount Cohen Time) Disposition Final User 08/11/2022 4:35:21 PM SEE PCP WITHIN 3 DAYS Yes Popejoy, RN, Mardella Layman PLEASE NOTE: All timestamps contained within this report are represented as [place] Standard Time. CONFIDENTIALTY NOTICE: This fax transmission is intended only for the addressee. It contains information that is legally privileged, confidential or otherwise protected from use or disclosure. If you are not the intended recipient, you are strictly prohibited from reviewing, disclosing, copying using or disseminating any of this information or taking any action in reliance on or regarding this information. If you have received this fax in error, please notify us immediately by telephone so that we can arrange for its return to [place]. Phone: [phone], Toll-Free: [phone],  Fax: [phone] Page: 2 of 2 Call Id: [id number] Final Disposition 08/11/2022 4:35:21 PM SEE PCP WITHIN 3 DAYS Yes Popejoy, RN, Verdis Frederickson Disagree/Comply Comply Caller Understands Yes PreDisposition Call Doctor Care Advice Given Per Guideline SEE PCP WITHIN 3 DAYS: * You need to be seen within 2 or 3 days. CALL BACK IF: * Weakness or numbness of the face, arm or leg on one side of the body occurs * Difficulty walking, difficulty talking, or severe headache occurs * Chest pain or difficulty breathing occurs * Your blood pressure is over 180/110 * You become worse CARE ADVICE given per High Blood Pressure (Adult) guideline

## 2022-08-11 NOTE — Telephone Encounter (Signed)
FYI: This call has been transferred to Access Nurse. Once the result note has been entered staff can address the message at that time.  Patient called in with the following symptoms:  Red Word:elevated blood pressure and Headaches  BP Readings:  197/90s 172/89  Please advise at Mobile [phone] (mobile)  Message is routed to Provider Pool and [facility] Triage

## 2022-08-11 NOTE — Telephone Encounter (Signed)
Noted  

## 2022-08-11 NOTE — Telephone Encounter (Signed)
Will wait for triage note.  

## 2022-08-12 ENCOUNTER — Ambulatory Visit: Payer: BC Managed Care – PPO | Admitting: Family Medicine

## 2022-08-12 ENCOUNTER — Encounter: Payer: Self-pay | Admitting: Family Medicine

## 2022-08-12 VITALS — BP 116/71 | HR 74 | Ht 67.0 in | Wt 242.0 lb

## 2022-08-12 DIAGNOSIS — I10 Essential (primary) hypertension: Secondary | ICD-10-CM

## 2022-08-12 LAB — COMPREHENSIVE METABOLIC PANEL
ALT: 17 U/L (ref 0–35)
AST: 18 U/L (ref 0–37)
Albumin: 3.8 g/dL (ref 3.5–5.2)
Alkaline Phosphatase: 51 U/L (ref 39–117)
BUN: 12 mg/dL (ref 6–23)
CO2: 26 mEq/L (ref 19–32)
Calcium: 8.8 mg/dL (ref 8.4–10.5)
Chloride: 103 mEq/L (ref 96–112)
Creatinine, Ser: 0.73 mg/dL (ref 0.40–1.20)
GFR: 96.77 mL/min (ref >60.00)
Glucose, Bld: 82 mg/dL (ref 70–99)
Potassium: 4.2 mEq/L (ref 3.5–5.1)
Sodium: 136 mEq/L (ref 135–145)
Total Bilirubin: 0.4 mg/dL (ref 0.2–1.2)
Total Protein: 6.9 g/dL (ref 6.0–8.3)

## 2022-08-12 MED ORDER — SPIRONOLACTONE 25 MG PO TABS: 25.0000 mg | ORAL_TABLET | Freq: Every day | ORAL | 0 refills | Status: DC

## 2022-08-12 MED ORDER — AMLODIPINE BESYLATE 5 MG PO TABS: 5.0000 mg | ORAL_TABLET | Freq: Every day | ORAL | 0 refills | Status: DC

## 2022-08-12 NOTE — Patient Instructions (Signed)
Blood pressure is at goal for age and co-morbidities since restarting your daily medications, previously poorly controlled.  Recommendations: restart your amlodipine and spironolactone - new prescriptions sent in.  - BP goal <130/80 - monitor and log blood pressures at home - check around the same time each day in a relaxed setting - Limit salt to <2000 mg/day - Follow DASH eating plan (heart healthy diet) - limit alcohol to 2 standard drinks per day for men and 1 per day for women - avoid tobacco products - get at least 2 hours of regular aerobic exercise weekly Patient aware of signs/symptoms requiring further/urgent evaluation. Labs updated today.

## 2022-08-12 NOTE — Progress Notes (Signed)
Acute Office Visit  Subjective:     Patient ID: Sarah Mcdowell, female    DOB: 1976-03-22, 47 y.o.   MRN: [id number]  Chief Complaint  Patient presents with   Hypertension   Headache     Patient is in today for high blood pressure.   Patient reports that she was at a GYN appointment last week and was told her BP was 139/90 and told to follow-up with PCP. Then yesterday, while at field day at school, SBP 170s. States she stopped taking her medications about a year ago. She has had occasional headaches off and on for awhile now, but never severe and no other symptoms Patient denies any chest pain, palpitations, dyspnea, wheezing, edema, vision changes. She would like to restart her medications. This morning she found her old amlodipine 5 mg and spironolactone 25 mg and restarted.       All review of systems negative except what is listed in the HPI      Objective:    BP 116/71   Pulse 74   Ht 5\' 7"  (1.702 m)   Wt 242 lb (109.8 kg)   SpO2 100%   BMI 37.90 kg/m    Physical Exam Vitals reviewed.  Constitutional:      General: She is not in acute distress.    Appearance: Normal appearance. She is well-developed. She is obese. She is not ill-appearing.  HENT:     Head: Normocephalic and atraumatic.  Eyes:     Extraocular Movements: Extraocular movements intact.     Right eye: Normal extraocular motion.     Left eye: Normal extraocular motion.  Cardiovascular:     Rate and Rhythm: Normal rate and regular rhythm.     Pulses: Normal pulses.     Heart sounds: Normal heart sounds.  Pulmonary:     Effort: Pulmonary effort is normal.     Breath sounds: Normal breath sounds.  Musculoskeletal:     Cervical back: Normal range of motion and neck supple.  Skin:    General: Skin is warm and dry.  Neurological:     Mental Status: She is alert and oriented to person, place, and time. Mental status is at baseline.     Cranial Nerves: No dysarthria or facial asymmetry.   Psychiatric:        Mood and Affect: Mood normal.        Behavior: Behavior normal.        Thought Content: Thought content normal.        Judgment: Judgment normal.     No results found for any visits on 08/12/22.      Assessment & Plan:   Problem List Items Addressed This Visit     Hypertension - Primary Blood pressure is at goal for age and co-morbidities since restarting your daily medications, previously poorly controlled.  Recommendations: restart your amlodipine and spironolactone - new prescriptions sent in.  - BP goal <130/80 - monitor and log blood pressures at home - check around the same time each day in a relaxed setting - Limit salt to <2000 mg/day - Follow DASH eating plan (heart healthy diet) - limit alcohol to 2 standard drinks per day for men and 1 per day for women - avoid tobacco products - get at least 2 hours of regular aerobic exercise weekly Patient aware of signs/symptoms requiring further/urgent evaluation. Labs updated today.    Relevant Medications   amLODipine (NORVASC) 5 MG tablet   spironolactone (ALDACTONE) 25 MG  tablet   Other Relevant Orders   Comprehensive metabolic panel          Meds ordered this encounter  Medications   amLODipine (NORVASC) 5 MG tablet    Sig: Take 1 tablet (5 mg total) by mouth daily.    Dispense:  90 tablet    Refill:  0    Order Specific Question:   Supervising Provider    Answer:   Danise Edge A [[id number]]   spironolactone (ALDACTONE) 25 MG tablet    Sig: Take 1 tablet (25 mg total) by mouth daily.    Dispense:  90 tablet    Refill:  0    Order Specific Question:   Supervising Provider    Answer:   Danise Edge A [[id number]]    Return in about 3 months (around 11/12/2022) for HTN follow-up (PCP).  Clayborne Dana, NP

## 2022-08-12 NOTE — Telephone Encounter (Signed)
Pt called. LVM advising patient to call back to discuss sxs and to be scheduled to to be seen.

## 2022-08-12 NOTE — Telephone Encounter (Signed)
It is not clear to me why her BP meds were stopped? She certainly needs to be on something.

## 2022-08-13 NOTE — Progress Notes (Signed)
Letter mailed. Done.

## 2022-08-28 ENCOUNTER — Other Ambulatory Visit: Payer: Self-pay | Admitting: Obstetrics and Gynecology

## 2022-11-12 ENCOUNTER — Ambulatory Visit: Payer: BC Managed Care – PPO | Admitting: Family Medicine

## 2022-12-14 ENCOUNTER — Other Ambulatory Visit: Payer: Self-pay | Admitting: Family Medicine

## 2022-12-14 DIAGNOSIS — I10 Essential (primary) hypertension: Secondary | ICD-10-CM

## 2022-12-28 ENCOUNTER — Other Ambulatory Visit: Payer: Self-pay | Admitting: Family Medicine

## 2022-12-28 DIAGNOSIS — I10 Essential (primary) hypertension: Secondary | ICD-10-CM

## 2022-12-29 LAB — HM PAP SMEAR
HM Pap smear: NORMAL
HPV, high-risk: NEGATIVE

## 2023-01-01 ENCOUNTER — Other Ambulatory Visit: Payer: Self-pay | Admitting: Family Medicine

## 2023-01-01 DIAGNOSIS — I10 Essential (primary) hypertension: Secondary | ICD-10-CM

## 2023-01-28 ENCOUNTER — Ambulatory Visit: Payer: BC Managed Care – PPO | Admitting: Family Medicine

## 2023-01-28 ENCOUNTER — Telehealth: Payer: Self-pay | Admitting: Family Medicine

## 2023-01-28 ENCOUNTER — Encounter: Payer: Self-pay | Admitting: Family Medicine

## 2023-01-28 DIAGNOSIS — Z6838 Body mass index (BMI) 38.0-38.9, adult: Secondary | ICD-10-CM

## 2023-01-28 DIAGNOSIS — I10 Essential (primary) hypertension: Secondary | ICD-10-CM | POA: Diagnosis not present

## 2023-01-28 MED ORDER — WEGOVY 0.25 MG/0.5ML ~~LOC~~ SOAJ
0.2500 mg | SUBCUTANEOUS | 0 refills | Status: DC
Start: 2023-01-28 — End: 2023-02-22

## 2023-01-28 NOTE — Telephone Encounter (Signed)
FYI: This call has been transferred to triage nurse: the Triage Nurse. Once the result note has been entered staff can address the message at that time.  Patient called in with the following symptoms:  Red Word:elevated blood pressure and Headache   Please advise at Mobile [phone] (mobile)  Message is routed to Provider Pool.
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
## 2023-01-28 NOTE — Telephone Encounter (Signed)
Appt scheduled today w/ PCP.

## 2023-01-28 NOTE — Telephone Encounter (Signed)
Initial Comment Caller states she has a headache and high blood pressure of 175/105 and 99 is oxygen level. Translation No Nurse Assessment Nurse: Noelle Penner, RN, [place] Derry Date/Time (Eastern Time): 01/28/2023 11:54:24 AM Confirm and document reason for call. If symptomatic, describe symptoms. ---Caller states BP is currently 175/105 associated with a headache. Does the patient have any new or worsening symptoms? ---Yes Will a triage be completed? ---Yes Related visit to physician within the last 2 weeks? ---No Does the PT have any chronic conditions? (i.e. diabetes, asthma, this includes High risk factors for pregnancy, etc.) ---Yes List chronic conditions. ---HTN, Is the patient pregnant or possibly pregnant? (Ask all females between the ages of 57-55) ---No Is this a behavioral health or substance abuse call? ---No Guidelines Guideline Title Affirmed Question Affirmed Notes Nurse Date/Time (Eastern Time) Blood Pressure - High Systolic BP >= 180 OR Diastolic >= 110 Gibbs, RN, Ethan 01/28/2023 11:54:57 AM Disp. Time Lamount Cohen Time) Disposition Final User 01/28/2023 11:57:52 AM See PCP within 24 Hours Yes Noelle Penner, RN, [place] Derry PLEASE NOTE: All timestamps contained within this report are represented as [place] Standard Time. CONFIDENTIALTY NOTICE: This fax transmission is intended only for the addressee. It contains information that is legally privileged, confidential or otherwise protected from use or disclosure. If you are not the intended recipient, you are strictly prohibited from reviewing, disclosing, copying using or disseminating any of this information or taking any action in reliance on or regarding this information. If you have received this fax in error, please notify us immediately by telephone so that we can arrange for its return to [place]. Phone: [phone], Toll-Free: [phone], Fax: [phone] Page: 2 of 2 Call Id: [id number] Final Disposition 01/28/2023 11:57:52 AM See PCP  within 24 Hours Yes Noelle Penner, RN, [place] Derry Caller Disagree/Comply Comply Caller Understands Yes PreDisposition InappropriateToAsk Care Advice Given Per Guideline * IF OFFICE WILL BE OPEN: You need to be examined within the next 24 hours. Call your doctor (or NP/PA) when the office opens and make an appointment. SEE PCP WITHIN 24 HOURS: CARE ADVICE given per Blood Pressure - High (Adult) guideline. CALL BACK IF: * Weakness or numbness of the face, arm or leg on one side of the body occurs * Difficulty walking, difficulty talking, or severe headache occurs * Chest pain or difficulty breathing occurs * You become worse Referrals REFERRED TO PCP OFFICE

## 2023-01-28 NOTE — Telephone Encounter (Signed)
noted 

## 2023-01-28 NOTE — Patient Instructions (Signed)

## 2023-01-29 LAB — COMPREHENSIVE METABOLIC PANEL
ALT: 22 U/L (ref 0–35)
AST: 17 U/L (ref 0–37)
Albumin: 4.1 g/dL (ref 3.5–5.2)
Alkaline Phosphatase: 70 U/L (ref 39–117)
BUN: 15 mg/dL (ref 6–23)
CO2: 26 meq/L (ref 19–32)
Calcium: 9 mg/dL (ref 8.4–10.5)
Chloride: 102 meq/L (ref 96–112)
Creatinine, Ser: 0.78 mg/dL (ref 0.40–1.20)
GFR: 89.08 mL/min (ref >60.00)
Glucose, Bld: 86 mg/dL (ref 70–99)
Potassium: 4.1 meq/L (ref 3.5–5.1)
Sodium: 134 meq/L — ABNORMAL LOW (ref 135–145)
Total Bilirubin: 0.3 mg/dL (ref 0.2–1.2)
Total Protein: 7.4 g/dL (ref 6.0–8.3)

## 2023-01-29 LAB — CBC WITH DIFFERENTIAL/PLATELET
Basophils Absolute: 0.1 10*3/uL (ref 0.0–0.1)
Basophils Relative: 1.4 % (ref 0.0–3.0)
Eosinophils Absolute: 0.2 10*3/uL (ref 0.0–0.7)
Eosinophils Relative: 3.3 % (ref 0.0–5.0)
HCT: 34.5 % — ABNORMAL LOW (ref 36.0–46.0)
Hemoglobin: 10.6 g/dL — ABNORMAL LOW (ref 12.0–15.0)
Lymphocytes Relative: 39 % (ref 12.0–46.0)
Lymphs Abs: 2.8 10*3/uL (ref 0.7–4.0)
MCHC: 30.6 g/dL (ref 30.0–36.0)
MCV: 66.3 fL — ABNORMAL LOW (ref 78.0–100.0)
Monocytes Absolute: 0.5 10*3/uL (ref 0.1–1.0)
Monocytes Relative: 7 % (ref 3.0–12.0)
Neutro Abs: 3.5 10*3/uL (ref 1.4–7.7)
Neutrophils Relative %: 49.3 % (ref 43.0–77.0)
Platelets: 273 10*3/uL (ref 150.0–400.0)
RBC: 5.2 Mil/uL — ABNORMAL HIGH (ref 3.87–5.11)
RDW: 18.9 % — ABNORMAL HIGH (ref 11.5–15.5)
WBC: 7.1 10*3/uL (ref 4.0–10.5)

## 2023-01-29 LAB — LIPID PANEL
Cholesterol: 238 mg/dL — ABNORMAL HIGH (ref 0–200)
HDL: 47.3 mg/dL (ref >39.00)
LDL Cholesterol: 154 mg/dL — ABNORMAL HIGH (ref 0–99)
NonHDL: 190.52
Total CHOL/HDL Ratio: 5
Triglycerides: 183 mg/dL — ABNORMAL HIGH (ref 0.0–149.0)
VLDL: 36.6 mg/dL (ref 0.0–40.0)

## 2023-01-29 LAB — TSH: TSH: 1.4 u[IU]/mL (ref 0.35–5.50)

## 2023-02-05 ENCOUNTER — Other Ambulatory Visit: Payer: Self-pay

## 2023-02-05 DIAGNOSIS — D649 Anemia, unspecified: Secondary | ICD-10-CM

## 2023-02-05 MED ORDER — IRON (FERROUS SULFATE) 325 (65 FE) MG PO TABS: 325.0000 mg | ORAL_TABLET | Freq: Every day | ORAL | 5 refills | Status: AC

## 2023-02-22 ENCOUNTER — Ambulatory Visit: Payer: BC Managed Care – PPO | Admitting: Family Medicine

## 2023-02-22 ENCOUNTER — Other Ambulatory Visit (HOSPITAL_BASED_OUTPATIENT_CLINIC_OR_DEPARTMENT_OTHER): Payer: Self-pay

## 2023-02-22 ENCOUNTER — Encounter: Payer: Self-pay | Admitting: Family Medicine

## 2023-02-22 VITALS — BP 140/90 | HR 79 | Temp 97.6°F | Resp 18 | Ht 67.0 in | Wt 244.2 lb

## 2023-02-22 DIAGNOSIS — I10 Essential (primary) hypertension: Secondary | ICD-10-CM | POA: Diagnosis not present

## 2023-02-22 DIAGNOSIS — Z6838 Body mass index (BMI) 38.0-38.9, adult: Secondary | ICD-10-CM | POA: Diagnosis not present

## 2023-02-22 MED ORDER — WEGOVY 0.25 MG/0.5ML ~~LOC~~ SOAJ
0.2500 mg | SUBCUTANEOUS | 0 refills | Status: DC
  Filled 2023-02-22: qty 2, 28d supply, fill #0

## 2023-02-22 MED ORDER — AMLODIPINE BESYLATE 10 MG PO TABS
10.0000 mg | ORAL_TABLET | Freq: Every day | ORAL | 1 refills | Status: DC
Start: 2023-02-22 — End: 2023-10-01
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
## 2023-02-22 NOTE — Assessment & Plan Note (Signed)
Poorly controlled will alter medications, encouraged DASH diet, minimize caffeine and obtain adequate sleep. Report concerning symptoms and follow up as directed and as needed  Inc norvasc to 10 mg daily

## 2023-03-09 ENCOUNTER — Ambulatory Visit: Payer: BC Managed Care – PPO | Admitting: Family Medicine

## 2023-03-26 ENCOUNTER — Other Ambulatory Visit: Payer: Self-pay | Admitting: Family Medicine

## 2023-03-26 DIAGNOSIS — I10 Essential (primary) hypertension: Secondary | ICD-10-CM

## 2023-09-28 ENCOUNTER — Ambulatory Visit (INDEPENDENT_AMBULATORY_CARE_PROVIDER_SITE_OTHER): Admitting: Family Medicine

## 2023-09-28 ENCOUNTER — Encounter: Payer: Self-pay | Admitting: Family Medicine

## 2023-09-28 VITALS — BP 122/78 | HR 93 | Temp 98.1°F | Resp 18 | Ht 67.0 in | Wt 248.2 lb

## 2023-09-28 DIAGNOSIS — I10 Essential (primary) hypertension: Secondary | ICD-10-CM

## 2023-09-28 DIAGNOSIS — H6502 Acute serous otitis media, left ear: Secondary | ICD-10-CM

## 2023-09-28 MED ORDER — FLUTICASONE PROPIONATE 50 MCG/ACT NA SUSP: 2.0000 | Freq: Every day | NASAL | 6 refills | Status: AC

## 2023-09-28 MED ORDER — ACETIC ACID 2 % OT SOLN: 4.0000 [drp] | OTIC | 0 refills | Status: AC

## 2023-09-28 MED ORDER — LEVOCETIRIZINE DIHYDROCHLORIDE 5 MG PO TABS: 5.0000 mg | ORAL_TABLET | Freq: Every evening | ORAL | 5 refills | Status: AC

## 2023-09-28 MED ORDER — SPIRONOLACTONE 25 MG PO TABS
25.0000 mg | ORAL_TABLET | Freq: Every day | ORAL | 3 refills | Status: AC
Start: 2023-09-28 — End: ?
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
## 2023-10-01 ENCOUNTER — Other Ambulatory Visit: Payer: Self-pay | Admitting: Family Medicine

## 2023-10-01 DIAGNOSIS — I10 Essential (primary) hypertension: Secondary | ICD-10-CM
